# Patient Record
Sex: Female | Born: 1975
Health system: Southern US, Community
[De-identification: ages and names within clinical notes are randomized; demographics above are authoritative.]

## PROBLEM LIST (undated history)

## (undated) DIAGNOSIS — F329 Major depressive disorder, single episode, unspecified: Secondary | ICD-10-CM

## (undated) DIAGNOSIS — E785 Hyperlipidemia, unspecified: Secondary | ICD-10-CM

## (undated) DIAGNOSIS — F419 Anxiety disorder, unspecified: Secondary | ICD-10-CM

## (undated) DIAGNOSIS — I10 Essential (primary) hypertension: Secondary | ICD-10-CM

## (undated) DIAGNOSIS — E041 Nontoxic single thyroid nodule: Secondary | ICD-10-CM

## (undated) DIAGNOSIS — Z973 Presence of spectacles and contact lenses: Secondary | ICD-10-CM

## (undated) DIAGNOSIS — T7840XA Allergy, unspecified, initial encounter: Secondary | ICD-10-CM

## (undated) DIAGNOSIS — D649 Anemia, unspecified: Secondary | ICD-10-CM

## (undated) HISTORY — DX: Major depressive disorder, single episode, unspecified: F32.9

## (undated) HISTORY — DX: Essential (primary) hypertension: I10

## (undated) HISTORY — DX: Anxiety disorder, unspecified: F41.9

## (undated) HISTORY — DX: Hyperlipidemia, unspecified: E78.5

## (undated) HISTORY — DX: Presence of spectacles and contact lenses: Z97.3

## (undated) HISTORY — DX: Nontoxic single thyroid nodule: E04.1

## (undated) HISTORY — DX: Allergy, unspecified, initial encounter: T78.40XA

## (undated) HISTORY — PX: BUNIONECTOMY: SHX129

## (undated) HISTORY — DX: Anemia, unspecified: D64.9

---

## 2002-12-27 ENCOUNTER — Encounter: Payer: Self-pay | Admitting: Emergency Medicine

## 2002-12-27 ENCOUNTER — Emergency Department (HOSPITAL_COMMUNITY): Admission: EM | Admit: 2002-12-27 | Discharge: 2002-12-27 | Payer: Self-pay | Admitting: Emergency Medicine

## 2003-07-20 ENCOUNTER — Emergency Department (HOSPITAL_COMMUNITY): Admission: EM | Admit: 2003-07-20 | Discharge: 2003-07-20 | Payer: Self-pay | Admitting: Family Medicine

## 2003-09-30 ENCOUNTER — Emergency Department (HOSPITAL_COMMUNITY): Admission: EM | Admit: 2003-09-30 | Discharge: 2003-09-30 | Payer: Self-pay | Admitting: Family Medicine

## 2004-09-15 ENCOUNTER — Emergency Department (HOSPITAL_COMMUNITY): Admission: EM | Admit: 2004-09-15 | Discharge: 2004-09-15 | Payer: Self-pay | Admitting: Emergency Medicine

## 2005-02-08 ENCOUNTER — Emergency Department (HOSPITAL_COMMUNITY): Admission: EM | Admit: 2005-02-08 | Discharge: 2005-02-08 | Payer: Self-pay | Admitting: Family Medicine

## 2017-07-20 ENCOUNTER — Encounter: Payer: Self-pay | Admitting: Family

## 2017-08-03 ENCOUNTER — Encounter: Payer: Self-pay | Admitting: Family

## 2017-10-13 DIAGNOSIS — E785 Hyperlipidemia, unspecified: Secondary | ICD-10-CM

## 2017-10-13 DIAGNOSIS — F32A Depression, unspecified: Secondary | ICD-10-CM

## 2017-10-13 DIAGNOSIS — I10 Essential (primary) hypertension: Secondary | ICD-10-CM

## 2017-10-13 HISTORY — DX: Hyperlipidemia, unspecified: E78.5

## 2017-10-13 HISTORY — DX: Essential (primary) hypertension: I10

## 2017-10-13 HISTORY — DX: Depression, unspecified: F32.A

## 2017-10-25 ENCOUNTER — Ambulatory Visit (INDEPENDENT_AMBULATORY_CARE_PROVIDER_SITE_OTHER): Payer: Self-pay | Admitting: Physician Assistant

## 2017-10-29 ENCOUNTER — Encounter (INDEPENDENT_AMBULATORY_CARE_PROVIDER_SITE_OTHER): Payer: Self-pay | Admitting: Physician Assistant

## 2017-10-29 ENCOUNTER — Ambulatory Visit (INDEPENDENT_AMBULATORY_CARE_PROVIDER_SITE_OTHER): Payer: No Typology Code available for payment source | Admitting: Physician Assistant

## 2017-10-29 ENCOUNTER — Other Ambulatory Visit: Payer: Self-pay

## 2017-10-29 VITALS — BP 159/97 | HR 86 | Temp 98.0°F | Ht 64.5 in | Wt 157.8 lb

## 2017-10-29 DIAGNOSIS — F17200 Nicotine dependence, unspecified, uncomplicated: Secondary | ICD-10-CM | POA: Diagnosis not present

## 2017-10-29 DIAGNOSIS — N921 Excessive and frequent menstruation with irregular cycle: Secondary | ICD-10-CM

## 2017-10-29 DIAGNOSIS — F439 Reaction to severe stress, unspecified: Secondary | ICD-10-CM | POA: Diagnosis not present

## 2017-10-29 DIAGNOSIS — R030 Elevated blood-pressure reading, without diagnosis of hypertension: Secondary | ICD-10-CM

## 2017-10-29 DIAGNOSIS — F418 Other specified anxiety disorders: Secondary | ICD-10-CM | POA: Diagnosis not present

## 2017-10-29 DIAGNOSIS — R600 Localized edema: Secondary | ICD-10-CM

## 2017-10-29 MED ORDER — ESCITALOPRAM OXALATE 10 MG PO TABS: 10.0000 mg | ORAL_TABLET | Freq: Every day | ORAL | 2 refills | Status: DC

## 2017-10-29 MED ORDER — VARENICLINE TARTRATE 0.5 MG X 11 & 1 MG X 42 PO MISC: ORAL | 0 refills | Status: DC

## 2017-10-29 NOTE — Patient Instructions (Signed)
Living With Depression Everyone experiences occasional disappointment, sadness, and loss in their lives. When you are feeling down, blue, or sad for at least 2 weeks in a row, it may mean that you have depression. Depression can affect your thoughts and feelings, relationships, daily activities, and physical health. It is caused by changes in the way your brain functions. If you receive a diagnosis of depression, your health care provider will tell you which type of depression you have and what treatment options are available to you. If you are living with depression, there are ways to help you recover from it and also ways to prevent it from coming back. How to cope with lifestyle changes Coping with stress Stress is your body's reaction to life changes and events, both good and bad. Stressful situations may include:  Getting married.  The death of a spouse.  Losing a job.  Retiring.  Having a baby.  Stress can last just a few hours or it can be ongoing. Stress can play a major role in depression, so it is important to learn both how to cope with stress and how to think about it differently. Talk with your health care provider or a counselor if you would like to learn more about stress reduction. He or she may suggest some stress reduction techniques, such as:  Music therapy. This can include creating music or listening to music. Choose music that you enjoy and that inspires you.  Mindfulness-based meditation. This kind of meditation can be done while sitting or walking. It involves being aware of your normal breaths, rather than trying to control your breathing.  Centering prayer. This is a kind of meditation that involves focusing on a spiritual word or phrase. Choose a word, phrase, or sacred image that is meaningful to you and that brings you peace.  Deep breathing. To do this, expand your stomach and inhale slowly through your nose. Hold your breath for 3-5 seconds, then exhale  slowly, allowing your stomach muscles to relax.  Muscle relaxation. This involves intentionally tensing muscles then relaxing them.  Choose a stress reduction technique that fits your lifestyle and personality. Stress reduction techniques take time and practice to develop. Set aside 5-15 minutes a day to do them. Therapists can offer training in these techniques. The training may be covered by some insurance plans. Other things you can do to manage stress include:  Keeping a stress diary. This can help you learn what triggers your stress and ways to control your response.  Understanding what your limits are and saying no to requests or events that lead to a schedule that is too full.  Thinking about how you respond to certain situations. You may not be able to control everything, but you can control how you react.  Adding humor to your life by watching funny films or TV shows.  Making time for activities that help you relax and not feeling guilty about spending your time this way.  Medicines Your health care provider may suggest certain medicines if he or she feels that they will help improve your condition. Avoid using alcohol and other substances that may prevent your medicines from working properly (may interact). It is also important to:  Talk with your pharmacist or health care provider about all the medicines that you take, their possible side effects, and what medicines are safe to take together.  Make it your goal to take part in all treatment decisions (shared decision-making). This includes giving input on the side   effects of medicines. It is best if shared decision-making with your health care provider is part of your total treatment plan.  If your health care provider prescribes a medicine, you may not notice the full benefits of it for 4-8 weeks. Most people who are treated for depression need to be on medicine for at least 6-12 months after they feel better. If you are taking  medicines as part of your treatment, do not stop taking medicines without first talking to your health care provider. You may need to have the medicine slowly decreased (tapered) over time to decrease the risk of harmful side effects. Relationships Your health care provider may suggest family therapy along with individual therapy and drug therapy. While there may not be family problems that are causing you to feel depressed, it is still important to make sure your family learns as much as they can about your mental health. Having your family's support can help make your treatment successful. How to recognize changes in your condition Everyone has a different response to treatment for depression. Recovery from major depression happens when you have not had signs of major depression for two months. This may mean that you will start to:  Have more interest in doing activities.  Feel less hopeless than you did 2 months ago.  Have more energy.  Overeat less often, or have better or improving appetite.  Have better concentration.  Your health care provider will work with you to decide the next steps in your recovery. It is also important to recognize when your condition is getting worse. Watch for these signs:  Having fatigue or low energy.  Eating too much or too little.  Sleeping too much or too little.  Feeling restless, agitated, or hopeless.  Having trouble concentrating or making decisions.  Having unexplained physical complaints.  Feeling irritable, angry, or aggressive.  Get help as soon as you or your family members notice these symptoms coming back. How to get support and help from others How to talk with friends and family members about your condition Talking to friends and family members about your condition can provide you with one way to get support and guidance. Reach out to trusted friends or family members, explain your symptoms to them, and let them know that you are  working with a health care provider to treat your depression. Financial resources Not all insurance plans cover mental health care, so it is important to check with your insurance carrier. If paying for co-pays or counseling services is a problem, search for a local or county mental health care center. They may be able to offer public mental health care services at low or no cost when you are not able to see a private health care provider. If you are taking medicine for depression, you may be able to get the generic form, which may be less expensive. Some makers of prescription medicines also offer help to patients who cannot afford the medicines they need. Follow these instructions at home:  Get the right amount and quality of sleep.  Cut down on using caffeine, tobacco, alcohol, and other potentially harmful substances.  Try to exercise, such as walking or lifting small weights.  Take over-the-counter and prescription medicines only as told by your health care provider.  Eat a healthy diet that includes plenty of vegetables, fruits, whole grains, low-fat dairy products, and lean protein. Do not eat a lot of foods that are high in solid fats, added sugars, or salt.    Keep all follow-up visits as told by your health care provider. This is important. Contact a health care provider if:  You stop taking your antidepressant medicines, and you have any of these symptoms: ? Nausea. ? Headache. ? Feeling lightheaded. ? Chills and body aches. ? Not being able to sleep (insomnia).  You or your friends and family think your depression is getting worse. Get help right away if:  You have thoughts of hurting yourself or others. If you ever feel like you may hurt yourself or others, or have thoughts about taking your own life, get help right away. You can go to your nearest emergency department or call:  Your local emergency services (911 in the [place]  A suicide crisis helpline, such as the  National Suicide Prevention Lifeline at 1-800-273-8255. This is open 24-hours a day.  Summary  If you are living with depression, there are ways to help you recover from it and also ways to prevent it from coming back.  Work with your health care team to create a management plan that includes counseling, stress management techniques, and healthy lifestyle habits. This information is not intended to replace advice given to you by your health care provider. Make sure you discuss any questions you have with your health care provider. Document Released: 04/03/2016 Document Revised: 04/03/2016 Document Reviewed: 04/03/2016 Elsevier Interactive Patient Education  2018 [employer]  

## 2017-10-29 NOTE — Progress Notes (Signed)
Subjective:  Patient ID: Taylor Booth, female    DOB: 1976/03/12  Age: 42 y.o. MRN: [id number]  CC: edema in feet  HPI Taylor Booth is a 42 y.o. female with a medical history of s/p BTL, bunion removal, depression and tobacco use disorder presents as a new patient with complaint of bilateral lower extremity edema and irregular menstruation. BP elevated today. Says she is highly stressed about her 10322 yo son that is suicidal. She has involuntarily committed her son to inpatient psychiatry. 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 Feels much stress at work, insomnia, irritability. Took paxil for approximately two months before quitting on her own because she felt better and interfered with sex drive. Pt smokes 3-4 cigarettes a day for approximately 10 years. Would like to quit smoking. PHQ9 15 and GAD7 15 in clinic today.     Menstrual irregularity onset approximately six months ago. Denies history fibroids or oophorectomy. No current birth control use.      ROS Review of Systems  Constitutional: Negative for chills, fever and malaise/fatigue.  Eyes: Negative for blurred vision.  Respiratory: Negative for shortness of breath.   Cardiovascular: Positive for leg swelling. Negative for chest pain and palpitations.  Gastrointestinal: Negative for abdominal pain and nausea.  Genitourinary: Negative for dysuria and hematuria.  Musculoskeletal: Negative for joint pain and myalgias.  Skin: Negative for rash.  Neurological: Negative for tingling and headaches.  Psychiatric/Behavioral: Positive for depression. The patient is nervous/anxious and has insomnia.     Objective:  BP (!) 159/97 (BP Location: Left Arm, Patient Position: Sitting, Cuff Size: Normal)   Pulse 86   Temp 98 F (36.7 C) (Oral)   Ht 5' 4.5" (1.638 m)   Wt 157 lb 12.8 oz (71.6 kg)   LMP 10/07/2017 (Exact Date) Comment: patient had a light menstrual again on june 3   SpO2 98%   BMI 26.67 kg/m   BP/Weight  10/29/2017  Systolic BP 159  Diastolic BP 97  Wt. (Lbs) 157.8  BMI 26.67      Physical Exam  Constitutional: She is oriented to person, place, and time.  Well developed, well nourished, NAD, polite  HENT:  Head: Normocephalic and atraumatic.  Eyes: No scleral icterus.  Neck: Normal range of motion. Neck supple. No thyromegaly present.  Cardiovascular: Normal rate, regular rhythm, normal heart sounds and intact distal pulses. Exam reveals no gallop and no friction rub.  No murmur heard. 1+ pitting edema of the lower extremity bilaterally.   Pulmonary/Chest: Effort normal. She has no wheezes. She has no rales.  Musculoskeletal: She exhibits no edema.  Neurological: She is alert and oriented to person, place, and time.  Skin: Skin is warm and dry. No rash noted. No erythema. No pallor.  Psychiatric: She has a normal mood and affect. Her behavior is normal. Thought content normal.  Vitals reviewed.    Assessment & Plan:   1. Elevated blood-pressure reading without diagnosis of hypertension - Lipid panel - Comprehensive metabolic panel - CBC with Differential - TSH - Return Friday for recheck and possible tx for HTN.   2. Lower extremity edema - Lipid panel - Comprehensive metabolic panel - CBC with Differential - TSH  3. Stress at home - Lipid panel - Comprehensive metabolic panel - CBC with Differential - TSH  4. Depression with anxiety - Begin Lexapro - Messaged Ms. Jenel LucksJasmine Lewis LCSW to reach out to pt for counseling.   5. Tobacco use disorder - Lipid panel - Comprehensive  metabolic panel - CBC with Differential - TSH - Begin Chantix  6. Menometrorrhagia - Advised patient to take OTC Aleve. Did not have time to address all her complaints. I asked she return to discuss further with possible orders for imaging. Will assess her TSH today.    Meds ordered this encounter  Medications  . varenicline (CHANTIX STARTING MONTH PAK) 0.5 MG X 11 & 1 MG X 42 tablet     Sig: Take one 0.5 mg tablet by mouth once daily for 3 days, then increase to one 0.5 mg tablet twice daily for 4 days, then increase to one 1 mg tablet twice daily.    Dispense:  53 tablet    Refill:  0    Order Specific Question:   Supervising Provider    Answer:   Hoy Register [[id number]]  . escitalopram (LEXAPRO) 10 MG tablet    Sig: Take 1 tablet (10 mg total) by mouth daily.    Dispense:  30 tablet    Refill:  2    Order Specific Question:   Supervising Provider    Answer:   Hoy Register [[id number]]    Follow-up: Return in about 4 days (around 11/02/2017) for elevated blood pressure. Loletta Specter PA

## 2017-10-30 ENCOUNTER — Telehealth (INDEPENDENT_AMBULATORY_CARE_PROVIDER_SITE_OTHER): Payer: Self-pay

## 2017-10-30 ENCOUNTER — Other Ambulatory Visit (INDEPENDENT_AMBULATORY_CARE_PROVIDER_SITE_OTHER): Payer: Self-pay | Admitting: Physician Assistant

## 2017-10-30 DIAGNOSIS — E7841 Elevated Lipoprotein(a): Secondary | ICD-10-CM

## 2017-10-30 DIAGNOSIS — D649 Anemia, unspecified: Secondary | ICD-10-CM

## 2017-10-30 DIAGNOSIS — R7989 Other specified abnormal findings of blood chemistry: Secondary | ICD-10-CM

## 2017-10-30 LAB — COMPREHENSIVE METABOLIC PANEL
ALK PHOS: 95 IU/L (ref 39–117)
ALT: 15 IU/L (ref 0–32)
AST: 18 IU/L (ref 0–40)
Albumin/Globulin Ratio: 1.2 (ref 1.2–2.2)
Albumin: 3 g/dL — ABNORMAL LOW (ref 3.5–5.5)
BUN/Creatinine Ratio: 21 (ref 9–23)
BUN: 12 mg/dL (ref 6–24)
Bilirubin Total: <0.2 mg/dL (ref 0.0–1.2)
CO2: 25 mmol/L (ref 20–29)
CREATININE: 0.57 mg/dL (ref 0.57–1.00)
Calcium: 8.6 mg/dL — ABNORMAL LOW (ref 8.7–10.2)
Chloride: 102 mmol/L (ref 96–106)
GFR calc Af Amer: 130 mL/min/{1.73_m2} (ref >59)
GFR calc non Af Amer: 113 mL/min/{1.73_m2} (ref >59)
GLUCOSE: 90 mg/dL (ref 65–99)
Globulin, Total: 2.5 g/dL (ref 1.5–4.5)
POTASSIUM: 3.5 mmol/L (ref 3.5–5.2)
SODIUM: 140 mmol/L (ref 134–144)
Total Protein: 5.5 g/dL — ABNORMAL LOW (ref 6.0–8.5)

## 2017-10-30 LAB — CBC WITH DIFFERENTIAL/PLATELET
Basophils Absolute: 0 10*3/uL (ref 0.0–0.2)
Basos: 0 %
EOS (ABSOLUTE): 0.1 10*3/uL (ref 0.0–0.4)
Eos: 2 %
Hematocrit: 29.6 % — ABNORMAL LOW (ref 34.0–46.6)
Hemoglobin: 9.1 g/dL — ABNORMAL LOW (ref 11.1–15.9)
IMMATURE GRANULOCYTES: 0 %
Immature Grans (Abs): 0 10*3/uL (ref 0.0–0.1)
Lymphocytes Absolute: 1.3 10*3/uL (ref 0.7–3.1)
Lymphs: 23 %
MCH: 24 pg — ABNORMAL LOW (ref 26.6–33.0)
MCHC: 30.7 g/dL — ABNORMAL LOW (ref 31.5–35.7)
MCV: 78 fL — ABNORMAL LOW (ref 79–97)
MONOS ABS: 0.8 10*3/uL (ref 0.1–0.9)
Monocytes: 14 %
NEUTROS PCT: 61 %
Neutrophils Absolute: 3.6 10*3/uL (ref 1.4–7.0)
PLATELETS: 550 10*3/uL — AB (ref 150–450)
RBC: 3.79 x10E6/uL (ref 3.77–5.28)
RDW: 17.8 % — AB (ref 12.3–15.4)
WBC: 5.8 10*3/uL (ref 3.4–10.8)

## 2017-10-30 LAB — LIPID PANEL
CHOL/HDL RATIO: 3.7 ratio (ref 0.0–4.4)
CHOLESTEROL TOTAL: 264 mg/dL — AB (ref 100–199)
HDL: 72 mg/dL (ref >39)
LDL CALC: 167 mg/dL — AB (ref 0–99)
Triglycerides: 125 mg/dL (ref 0–149)
VLDL CHOLESTEROL CAL: 25 mg/dL (ref 5–40)

## 2017-10-30 LAB — TSH: TSH: 0.262 u[IU]/mL — ABNORMAL LOW (ref 0.450–4.500)

## 2017-10-30 MED ORDER — FERROUS SULFATE 325 (65 FE) MG PO TBEC: 325.0000 mg | DELAYED_RELEASE_TABLET | Freq: Three times a day (TID) | ORAL | 1 refills | Status: DC

## 2017-10-30 MED ORDER — ATORVASTATIN CALCIUM 40 MG PO TABS: 40.0000 mg | ORAL_TABLET | Freq: Every day | ORAL | 1 refills | Status: DC

## 2017-10-30 NOTE — Telephone Encounter (Signed)
Patients insurance will not cover Chantix. They will cover Topamax, please send to [facility] on [address]. Maryjean Mornempestt S Danarius Mcconathy, CMA

## 2017-10-30 NOTE — Telephone Encounter (Signed)
I have not heard of Topamax being used for smoking cessation. I checked uptodate and do not see tobacco use as an indication for use. She will need to call again and find out what else her insurance will cover.

## 2017-10-30 NOTE — Telephone Encounter (Signed)
Patient will call insurance company again to inquire on what they will cover for smoking cessation. If nothing else patient is aware that the only other option is to apply for the PASS program through [facility] to have the chantix covered at possibly no cost. Maryjean Mornempestt S Emir Nack, CMA

## 2017-10-31 ENCOUNTER — Telehealth (INDEPENDENT_AMBULATORY_CARE_PROVIDER_SITE_OTHER): Payer: Self-pay

## 2017-10-31 NOTE — Telephone Encounter (Signed)
Patient called regarding another issue. While on the call patient was informed of results. Patient is aware of elevated LDL and atorvastatin being sent to pharmacy. Advised to refrain from getting pregnant while taking atorvastatin. Patient is aware that she is anemic and iron pills have been sent to her pharmacy. Patient aware of abnormal TSH, need for full thyroid panel. Patient will come in on 6/21 for thyroid panel and blood pressure check. Maryjean Mornempestt S Hind Chesler, CMA

## 2017-10-31 NOTE — Telephone Encounter (Signed)
-----   Message from Loletta Specteroger David Gomez, PA-C sent at 10/30/2017  5:22 PM EDT ----- LDL elevated. Pt anemic. TSH abnormal. I will send atorvastatin and iron pills to [facility] at [address]. Remind to take care against pregnancy with atorvastatin use. Also, I have ordered thyroid panel for her to come in and get done, no consult necessary.

## 2017-11-01 ENCOUNTER — Other Ambulatory Visit (INDEPENDENT_AMBULATORY_CARE_PROVIDER_SITE_OTHER): Payer: Self-pay | Admitting: Physician Assistant

## 2017-11-01 MED ORDER — VARENICLINE TARTRATE 0.5 MG X 11 & 1 MG X 42 PO MISC: ORAL | 0 refills | Status: DC

## 2017-11-01 NOTE — Telephone Encounter (Signed)
Pt will have to pick up rx because instructions are too long to be transmitted through Epic.

## 2017-11-01 NOTE — Telephone Encounter (Signed)
Patients insurance will only pay for nicotine patches and gum. Patient would like to have Rx for chantix sent to [facility], and apply for PASS to cover the cost.

## 2017-11-01 NOTE — Telephone Encounter (Signed)
Patient aware and will pick up in the morning during lab visit. Maryjean Mornempestt S Roberts, CMA

## 2017-11-02 ENCOUNTER — Encounter (INDEPENDENT_AMBULATORY_CARE_PROVIDER_SITE_OTHER): Payer: Self-pay

## 2017-11-02 ENCOUNTER — Other Ambulatory Visit (INDEPENDENT_AMBULATORY_CARE_PROVIDER_SITE_OTHER): Payer: Self-pay | Admitting: Physician Assistant

## 2017-11-02 ENCOUNTER — Telehealth: Payer: Self-pay | Admitting: Licensed Clinical Social Worker

## 2017-11-02 ENCOUNTER — Ambulatory Visit (INDEPENDENT_AMBULATORY_CARE_PROVIDER_SITE_OTHER): Payer: No Typology Code available for payment source | Admitting: Physician Assistant

## 2017-11-02 DIAGNOSIS — R7989 Other specified abnormal findings of blood chemistry: Secondary | ICD-10-CM | POA: Diagnosis not present

## 2017-11-02 DIAGNOSIS — I10 Essential (primary) hypertension: Secondary | ICD-10-CM

## 2017-11-02 MED ORDER — HYDROCHLOROTHIAZIDE 25 MG PO TABS: 25.0000 mg | ORAL_TABLET | Freq: Every day | ORAL | 11 refills | Status: DC

## 2017-11-02 MED FILL — HYDROCHLOROTHIAZIDE 25 MG T: 25 | 30 days supply | Qty: 30 | Fill #0

## 2017-11-02 NOTE — Patient Instructions (Signed)
Managing Your Hypertension Hypertension is commonly called high blood pressure. This is when the force of your blood pressing against the walls of your arteries is too strong. Arteries are blood vessels that carry blood from your heart throughout your body. Hypertension forces the heart to work harder to pump blood, and may cause the arteries to become narrow or stiff. Having untreated or uncontrolled hypertension can cause heart attack, stroke, kidney disease, and other problems. What are blood pressure readings? A blood pressure reading consists of a higher number over a lower number. Ideally, your blood pressure should be below 120/80. The first ("top") number is called the systolic pressure. It is a measure of the pressure in your arteries as your heart beats. The second ("bottom") number is called the diastolic pressure. It is a measure of the pressure in your arteries as the heart relaxes. What does my blood pressure reading mean? Blood pressure is classified into four stages. Based on your blood pressure reading, your health care provider may use the following stages to determine what type of treatment you need, if any. Systolic pressure and diastolic pressure are measured in a unit called mm Hg. Normal  Systolic pressure: below 120.  Diastolic pressure: below 80. Elevated  Systolic pressure: 120-129.  Diastolic pressure: below 80. Hypertension stage 1  Systolic pressure: 130-139.  Diastolic pressure: 80-89. Hypertension stage 2  Systolic pressure: 140 or above.  Diastolic pressure: 90 or above. What health risks are associated with hypertension? Managing your hypertension is an important responsibility. Uncontrolled hypertension can lead to:  A heart attack.  A stroke.  A weakened blood vessel (aneurysm).  Heart failure.  Kidney damage.  Eye damage.  Metabolic syndrome.  Memory and concentration problems.  What changes can I make to manage my  hypertension? Hypertension can be managed by making lifestyle changes and possibly by taking medicines. Your health care provider will help you make a plan to bring your blood pressure within a normal range. Eating and drinking  Eat a diet that is high in fiber and potassium, and low in salt (sodium), added sugar, and fat. An example eating plan is called the DASH (Dietary Approaches to Stop Hypertension) diet. To eat this way: ? Eat plenty of fresh fruits and vegetables. Try to fill half of your plate at each meal with fruits and vegetables. ? Eat whole grains, such as whole wheat pasta, brown rice, or whole grain bread. Fill about one quarter of your plate with whole grains. ? Eat low-fat diary products. ? Avoid fatty cuts of meat, processed or cured meats, and poultry with skin. Fill about one quarter of your plate with lean proteins such as fish, chicken without skin, beans, eggs, and tofu. ? Avoid premade and processed foods. These tend to be higher in sodium, added sugar, and fat.  Reduce your daily sodium intake. Most people with hypertension should eat less than 1,500 mg of sodium a day.  Limit alcohol intake to no more than 1 drink a day for nonpregnant women and 2 drinks a day for men. One drink equals 12 oz of beer, 5 oz of wine, or 1 oz of hard liquor. Lifestyle  Work with your health care provider to maintain a healthy body weight, or to lose weight. Ask what an ideal weight is for you.  Get at least 30 minutes of exercise that causes your heart to beat faster (aerobic exercise) most days of the week. Activities may include walking, swimming, or biking.  Include exercise   to strengthen your muscles (resistance exercise), such as weight lifting, as part of your weekly exercise routine. Try to do these types of exercises for 30 minutes at least 3 days a week.  Do not use any products that contain nicotine or tobacco, such as cigarettes and e-cigarettes. If you need help quitting, ask  your health care provider.  Control any long-term (chronic) conditions you have, such as high cholesterol or diabetes. Monitoring  Monitor your blood pressure at home as told by your health care provider. Your personal target blood pressure may vary depending on your medical conditions, your age, and other factors.  Have your blood pressure checked regularly, as often as told by your health care provider. Working with your health care provider  Review all the medicines you take with your health care provider because there may be side effects or interactions.  Talk with your health care provider about your diet, exercise habits, and other lifestyle factors that may be contributing to hypertension.  Visit your health care provider regularly. Your health care provider can help you create and adjust your plan for managing hypertension. Will I need medicine to control my blood pressure? Your health care provider may prescribe medicine if lifestyle changes are not enough to get your blood pressure under control, and if:  Your systolic blood pressure is 130 or higher.  Your diastolic blood pressure is 80 or higher.  Take medicines only as told by your health care provider. Follow the directions carefully. Blood pressure medicines must be taken as prescribed. The medicine does not work as well when you skip doses. Skipping doses also puts you at risk for problems. Contact a health care provider if:  You think you are having a reaction to medicines you have taken.  You have repeated (recurrent) headaches.  You feel dizzy.  You have swelling in your ankles.  You have trouble with your vision. Get help right away if:  You develop a severe headache or confusion.  You have unusual weakness or numbness, or you feel faint.  You have severe pain in your chest or abdomen.  You vomit repeatedly.  You have trouble breathing. Summary  Hypertension is when the force of blood pumping through  your arteries is too strong. If this condition is not controlled, it may put you at risk for serious complications.  Your personal target blood pressure may vary depending on your medical conditions, your age, and other factors. For most people, a normal blood pressure is less than 120/80.  Hypertension is managed by lifestyle changes, medicines, or both. Lifestyle changes include weight loss, eating a healthy, low-sodium diet, exercising more, and limiting alcohol. This information is not intended to replace advice given to you by your health care provider. Make sure you discuss any questions you have with your health care provider. Document Released: 01/24/2012 Document Revised: 03/29/2016 Document Reviewed: 03/29/2016 Elsevier Interactive Patient Education  2018 [employer]  DASH Eating Plan DASH stands for "Dietary Approaches to Stop Hypertension." The DASH eating plan is a healthy eating plan that has been shown to reduce high blood pressure (hypertension). It may also reduce your risk for type 2 diabetes, heart disease, and stroke. The DASH eating plan may also help with weight loss. What are tips for following this plan? General guidelines  Avoid eating more than 2,300 mg (milligrams) of salt (sodium) a day. If you have hypertension, you may need to reduce your sodium intake to 1,500 mg a day.  Limit alcohol intake   to no more than 1 drink a day for nonpregnant women and 2 drinks a day for men. One drink equals 12 oz of beer, 5 oz of wine, or 1 oz of hard liquor.  Work with your health care provider to maintain a healthy body weight or to lose weight. Ask what an ideal weight is for you.  Get at least 30 minutes of exercise that causes your heart to beat faster (aerobic exercise) most days of the week. Activities may include walking, swimming, or biking.  Work with your health care provider or diet and nutrition specialist (dietitian) to adjust your eating plan to your individual  calorie needs. Reading food labels  Check food labels for the amount of sodium per serving. Choose foods with less than 5 percent of the Daily Value of sodium. Generally, foods with less than 300 mg of sodium per serving fit into this eating plan.  To find whole grains, look for the word "whole" as the first word in the ingredient list. Shopping  Buy products labeled as "low-sodium" or "no salt added."  Buy fresh foods. Avoid canned foods and premade or frozen meals. Cooking  Avoid adding salt when cooking. Use salt-free seasonings or herbs instead of table salt or sea salt. Check with your health care provider or pharmacist before using salt substitutes.  Do not fry foods. Cook foods using healthy methods such as baking, boiling, grilling, and broiling instead.  Cook with heart-healthy oils, such as olive, canola, soybean, or sunflower oil. Meal planning   Eat a balanced diet that includes: ? 5 or more servings of fruits and vegetables each day. At each meal, try to fill half of your plate with fruits and vegetables. ? Up to 6-8 servings of whole grains each day. ? Less than 6 oz of lean meat, poultry, or fish each day. A 3-oz serving of meat is about the same size as a deck of cards. One egg equals 1 oz. ? 2 servings of low-fat dairy each day. ? A serving of nuts, seeds, or beans 5 times each week. ? Heart-healthy fats. Healthy fats called Omega-3 fatty acids are found in foods such as flaxseeds and coldwater fish, like sardines, salmon, and mackerel.  Limit how much you eat of the following: ? Canned or prepackaged foods. ? Food that is high in trans fat, such as fried foods. ? Food that is high in saturated fat, such as fatty meat. ? Sweets, desserts, sugary drinks, and other foods with added sugar. ? Full-fat dairy products.  Do not salt foods before eating.  Try to eat at least 2 vegetarian meals each week.  Eat more home-cooked food and less restaurant, buffet, and fast  food.  When eating at a restaurant, ask that your food be prepared with less salt or no salt, if possible. What foods are recommended? The items listed may not be a complete list. Talk with your dietitian about what dietary choices are best for you. Grains Whole-grain or whole-wheat bread. Whole-grain or whole-wheat pasta. Brown rice. Oatmeal. Quinoa. Bulgur. Whole-grain and low-sodium cereals. Pita bread. Low-fat, low-sodium crackers. Whole-wheat flour tortillas. Vegetables Fresh or frozen vegetables (raw, steamed, roasted, or grilled). Low-sodium or reduced-sodium tomato and vegetable juice. Low-sodium or reduced-sodium tomato sauce and tomato paste. Low-sodium or reduced-sodium canned vegetables. Fruits All fresh, dried, or frozen fruit. Canned fruit in natural juice (without added sugar). Meat and other protein foods Skinless chicken or turkey. Ground chicken or turkey. Pork with fat trimmed off. Fish   and seafood. Egg whites. Dried beans, peas, or lentils. Unsalted nuts, nut butters, and seeds. Unsalted canned beans. Lean cuts of beef with fat trimmed off. Low-sodium, lean deli meat. Dairy Low-fat (1%) or fat-free (skim) milk. Fat-free, low-fat, or reduced-fat cheeses. Nonfat, low-sodium ricotta or cottage cheese. Low-fat or nonfat yogurt. Low-fat, low-sodium cheese. Fats and oils Soft margarine without trans fats. Vegetable oil. Low-fat, reduced-fat, or light mayonnaise and salad dressings (reduced-sodium). Canola, safflower, olive, soybean, and sunflower oils. Avocado. Seasoning and other foods Herbs. Spices. Seasoning mixes without salt. Unsalted popcorn and pretzels. Fat-free sweets. What foods are not recommended? The items listed may not be a complete list. Talk with your dietitian about what dietary choices are best for you. Grains Baked goods made with fat, such as croissants, muffins, or some breads. Dry pasta or rice meal packs. Vegetables Creamed or fried vegetables. Vegetables  in a cheese sauce. Regular canned vegetables (not low-sodium or reduced-sodium). Regular canned tomato sauce and paste (not low-sodium or reduced-sodium). Regular tomato and vegetable juice (not low-sodium or reduced-sodium). Pickles. Olives. Fruits Canned fruit in a light or heavy syrup. Fried fruit. Fruit in cream or butter sauce. Meat and other protein foods Fatty cuts of meat. Ribs. Fried meat. Bacon. Sausage. Bologna and other processed lunch meats. Salami. Fatback. Hotdogs. Bratwurst. Salted nuts and seeds. Canned beans with added salt. Canned or smoked fish. Whole eggs or egg yolks. Chicken or turkey with skin. Dairy Whole or 2% milk, cream, and half-and-half. Whole or full-fat cream cheese. Whole-fat or sweetened yogurt. Full-fat cheese. Nondairy creamers. Whipped toppings. Processed cheese and cheese spreads. Fats and oils Butter. Stick margarine. Lard. Shortening. Ghee. Bacon fat. Tropical oils, such as coconut, palm kernel, or palm oil. Seasoning and other foods Salted popcorn and pretzels. Onion salt, garlic salt, seasoned salt, table salt, and sea salt. Worcestershire sauce. Tartar sauce. Barbecue sauce. Teriyaki sauce. Soy sauce, including reduced-sodium. Steak sauce. Canned and packaged gravies. Fish sauce. Oyster sauce. Cocktail sauce. Horseradish that you find on the shelf. Ketchup. Mustard. Meat flavorings and tenderizers. Bouillon cubes. Hot sauce and Tabasco sauce. Premade or packaged marinades. Premade or packaged taco seasonings. Relishes. Regular salad dressings. Where to find more information:  National Heart, Lung, and Blood Institute: www.nhlbi.nih.gov  American Heart Association: www.heart.org Summary  The DASH eating plan is a healthy eating plan that has been shown to reduce high blood pressure (hypertension). It may also reduce your risk for type 2 diabetes, heart disease, and stroke.  With the DASH eating plan, you should limit salt (sodium) intake to 2,300 mg a  day. If you have hypertension, you may need to reduce your sodium intake to 1,500 mg a day.  When on the DASH eating plan, aim to eat more fresh fruits and vegetables, whole grains, lean proteins, low-fat dairy, and heart-healthy fats.  Work with your health care provider or diet and nutrition specialist (dietitian) to adjust your eating plan to your individual calorie needs. This information is not intended to replace advice given to you by your health care provider. Make sure you discuss any questions you have with your health care provider. Document Released: 04/20/2011 Document Revised: 04/24/2016 Document Reviewed: 04/24/2016 Elsevier Interactive Patient Education  2018 Elsevier Inc.  

## 2017-11-02 NOTE — Telephone Encounter (Signed)
LCSWA attempted to contact pt to follow up on behavioral health screens from prior appointment. LCSWA left message for a return call.  

## 2017-11-02 NOTE — Progress Notes (Signed)
Nurse visit: BP still elevated. HTN confirmed.   Pt will likely need two anti-hypertensive medications to control BP. Patient prefers to take one anti-hypertensive and diet/exercise/low sodium restrictions. Advised not to consume more than 2000 mg of sodium per day.

## 2017-11-02 NOTE — Progress Notes (Signed)
Nurse visit for BP check. HTN confirmed today.   Pt would likely need two antihypertensives for control of BP. Pt prefers to try one anti-hypertensive and diet/exercise. She was advised on consuming less than 2000 mg of sodium per day.

## 2017-11-03 LAB — THYROID PANEL WITH TSH
Free Thyroxine Index: 1.6 (ref 1.2–4.9)
T3 UPTAKE RATIO: 25 % (ref 24–39)
T4, Total: 6.3 ug/dL (ref 4.5–12.0)
TSH: 0.272 u[IU]/mL — ABNORMAL LOW (ref 0.450–4.500)

## 2017-11-05 ENCOUNTER — Telehealth (INDEPENDENT_AMBULATORY_CARE_PROVIDER_SITE_OTHER): Payer: Self-pay | Admitting: Physician Assistant

## 2017-11-05 ENCOUNTER — Other Ambulatory Visit (INDEPENDENT_AMBULATORY_CARE_PROVIDER_SITE_OTHER): Payer: Self-pay | Admitting: Physician Assistant

## 2017-11-05 MED ORDER — AMLODIPINE BESYLATE 10 MG PO TABS: 10.0000 mg | ORAL_TABLET | Freq: Every day | ORAL | 5 refills | Status: DC

## 2017-11-05 NOTE — Telephone Encounter (Signed)
Patient states all these symptoms began after she took hydrochlorothiazide. Please advise. Taylor Booth S Taylor Booth, CMA

## 2017-11-05 NOTE — Telephone Encounter (Signed)
Patient called stating that on Saturday 11-03-2017 she was feeling nervous, overwhelmed, over heating and had shortens of breath. Patient states that over the weekend she had muscle spasms and on Saturday it started off with two and progressed to have a lot more over Sunday all day and was waken up every hour at night to have to walk it out.   Please Follow Up [phone]  Thank you

## 2017-11-05 NOTE — Telephone Encounter (Signed)
She may stop HCTZ. I suspect she was having symptoms of dehydration. I will switch to amlodipine.

## 2017-11-05 NOTE — Telephone Encounter (Signed)
Left patient a message to stop taking HCTZ. Will be switched to amlodipine. Call [facility] with any questions. Maryjean Mornempestt S Hartlyn Reigel, CMA

## 2017-11-06 MED FILL — AMLODIPINE BESYLATE 10 MG T: 10 | 30 days supply | Qty: 30 | Fill #0

## 2017-11-09 ENCOUNTER — Telehealth: Payer: Self-pay

## 2017-11-09 NOTE — Telephone Encounter (Signed)
-----   Message from Loletta Specteroger David Gomez, PA-C sent at 11/06/2017  6:00 PM EDT ----- Subclinical hyperthyroidism. She will need to repeat thyroid panel in three months. If persistent, she will need further testing.

## 2017-11-09 NOTE — Telephone Encounter (Signed)
Patient aware of subclinical hyperthyroidism. Repeat thyroid panel in three months, if persistent will need further testing. Maryjean Mornempestt S Roberts, CMA

## 2017-11-16 ENCOUNTER — Ambulatory Visit
Payer: No Typology Code available for payment source | Attending: Physician Assistant | Admitting: Licensed Clinical Social Worker

## 2017-11-16 DIAGNOSIS — F32A Depression, unspecified: Secondary | ICD-10-CM

## 2017-11-16 DIAGNOSIS — F329 Major depressive disorder, single episode, unspecified: Secondary | ICD-10-CM

## 2017-11-16 NOTE — BH Specialist Note (Signed)
Integrated Behavioral Health Initial Visit  MRN: [id number] Name: Taylor Booth  Number of Integrated Behavioral Health Clinician visits:: 1/6 Session Start time: 4:10 PM  Session End time: 4:40 PM  Total time: 30 minutes  Type of Service: Integrated Behavioral Health- Individual/Family Interpretor:No. Interpretor Name and Language: N/A   Warm Hand Off Completed.       SUBJECTIVE: Taylor Regalandra Aerts is a 42 y.o. female accompanied by self Patient was referred by Conley SimmondsPA-C Gomez for depression and anxiety. Patient reports the following symptoms/concerns: decreased pleasure in doing things, feelings of sadness and worry, difficulty sleeping, low energy, decreased concentration, restlessness, and irritability Duration of problem: "for a while"; Severity of problem: moderate  OBJECTIVE: Mood: Appropriate and Affect: Appropriate Risk of harm to self or others: No plan to harm self or others  LIFE CONTEXT: Family and Social: Pt has two adult children, [place], youngest [place] in [place]. Pt reports receiving support from family and friends School/Work: Pt is employed full time.  Self-Care: Pt enjoys reading, cooking, listening to music, and meditating. She participates in medication management through PCP Life Changes: Pt reports recently having to IVC adult child due to his MH conditions. She receives limited support, has a very "demanding" job, and has difficulty managing medical condition   GOALS ADDRESSED: Patient will: 1. Reduce symptoms of: anxiety and depression 2. Increase knowledge and/or ability of: coping skills and healthy habits  3. Demonstrate ability to: Increase healthy adjustment to current life circumstances and Increase adequate support systems for patient/family  INTERVENTIONS: Interventions utilized: Mindfulness or Management consultantelaxation Training, Supportive Counseling and Psychoeducation and/or Health Education  Standardized Assessments completed: Not  Needed  ASSESSMENT: Patient currently experiencing depression and anxiety triggered by stress from work, difficulty managing medical condition, and ongoing worry about the mental health of a child who resides out of state. She reports decreased pleasure in doing things, feelings of sadness and worry, difficulty sleeping, low energy, decreased concentration, restlessness, and irritability. Denies SI/HI/AVH.    Patient may benefit from psychoeducation and psychotherapy. LCSWA educated pt on the correlation between one's physical and mental health. Pt had good insight on triggers and was successful in identifying healthy coping skills. LCSWA discussed healthy strategies to assist in stress management. Pt is participating in medication management through PCP and reports an increase in positive mood and motivation, since taking Lexapro.  Pt states that she is taking her BP medication as prescribed; however, has some concerns regarding side effects. LCSWA will message CPP, Georgiana ShoreLuke Van Ausdall to reach out for medication management.   PLAN: 1. Follow up with behavioral health clinician on : Pt was encouraged to contact LCSWA if symptoms worsen or fail to improve [facility] 2. Behavioral recommendations: LCSWA recommends that pt apply healthy coping skills discussed. Pt is encouraged to schedule follow up appointment with LCSWA 3. Referral(s): Integrated Behavioral Health Services (In Clinic) 4. "From scale of 1-10, how likely are you to follow plan?":   Bridgett LarssonJasmine D Missey Hasley, LCSW 11/19/17 9:19 AM

## 2017-11-19 ENCOUNTER — Telehealth: Payer: Self-pay | Admitting: Pharmacist

## 2017-11-19 NOTE — Telephone Encounter (Signed)
-----   Message from Bridgett LarssonJasmine D Lewis, LCSW sent at 11/19/2017  9:20 AM EDT ----- Regarding: Appointment needed Pt states that she is taking her BP medication as prescribed; however, has some concerns regarding side effects. Could you reach out for medication management.

## 2017-11-19 NOTE — Telephone Encounter (Signed)
PC placed @ 12:10 PM. [facility] that she is having a suspected side effect to her blood pressure medication. Left VM instructing patient to call me back to determine what is going on. Will follow-up with her as she able to get in-touch with me.

## 2017-11-19 NOTE — Telephone Encounter (Signed)
Left message asking patient to call the office. Tempestt S Roberts, CMA  

## 2017-11-19 NOTE — Telephone Encounter (Signed)
Taylor Booth would you try contacting patient to get specifics please? Thank you.

## 2017-11-20 NOTE — Telephone Encounter (Signed)
Left another message asking patient to call office and provide specifics on the side effects she believes her blood pressure medication is causing. Taylor Booth, CMA

## 2017-11-26 MED FILL — $CHANTIX STARTING MONTH BOX: 0.5 MG X 11 | 28 days supply | Qty: 53 | Fill #0

## 2017-12-25 MED FILL — AMLODIPINE BESYLATE 10 MG T: 10 | 30 days supply | Qty: 30 | Fill #1

## 2017-12-28 ENCOUNTER — Ambulatory Visit (INDEPENDENT_AMBULATORY_CARE_PROVIDER_SITE_OTHER): Payer: No Typology Code available for payment source | Admitting: Physician Assistant

## 2017-12-28 MED FILL — ATORVASTATIN CALCIUM 40 MG: 40 | 30 days supply | Qty: 30 | Fill #0

## 2018-01-29 MED FILL — AMLODIPINE BESYLATE 10 MG T: 10 | 30 days supply | Qty: 30 | Fill #2

## 2018-01-29 MED FILL — ATORVASTATIN CALCIUM 40 MG: 40 | 30 days supply | Qty: 30 | Fill #1

## 2018-01-30 ENCOUNTER — Encounter (INDEPENDENT_AMBULATORY_CARE_PROVIDER_SITE_OTHER): Payer: Self-pay | Admitting: Physician Assistant

## 2018-01-30 ENCOUNTER — Ambulatory Visit (INDEPENDENT_AMBULATORY_CARE_PROVIDER_SITE_OTHER): Payer: No Typology Code available for payment source | Admitting: Physician Assistant

## 2018-01-30 ENCOUNTER — Other Ambulatory Visit: Payer: Self-pay

## 2018-01-30 VITALS — BP 137/90 | HR 79 | Temp 98.3°F | Ht 64.5 in | Wt 157.2 lb

## 2018-01-30 DIAGNOSIS — I10 Essential (primary) hypertension: Secondary | ICD-10-CM

## 2018-01-30 DIAGNOSIS — E7841 Elevated Lipoprotein(a): Secondary | ICD-10-CM | POA: Diagnosis not present

## 2018-01-30 DIAGNOSIS — F418 Other specified anxiety disorders: Secondary | ICD-10-CM

## 2018-01-30 DIAGNOSIS — F17200 Nicotine dependence, unspecified, uncomplicated: Secondary | ICD-10-CM | POA: Diagnosis not present

## 2018-01-30 MED ORDER — ESCITALOPRAM OXALATE 10 MG PO TABS: 10.0000 mg | ORAL_TABLET | Freq: Every day | ORAL | 2 refills | Status: DC

## 2018-01-30 MED ORDER — ATORVASTATIN CALCIUM 40 MG PO TABS: 40.0000 mg | ORAL_TABLET | Freq: Every day | ORAL | 6 refills | Status: DC

## 2018-01-30 MED ORDER — AMLODIPINE BESYLATE 10 MG PO TABS: 10.0000 mg | ORAL_TABLET | Freq: Every day | ORAL | 6 refills | Status: DC

## 2018-01-30 MED ORDER — LOSARTAN POTASSIUM 25 MG PO TABS: 25.0000 mg | ORAL_TABLET | Freq: Every day | ORAL | 6 refills | Status: DC

## 2018-01-30 MED ORDER — VARENICLINE TARTRATE 1 MG PO TABS: 1.0000 mg | ORAL_TABLET | Freq: Two times a day (BID) | ORAL | 2 refills | Status: DC

## 2018-01-30 MED FILL — ESCITALOPRAM 10 MG TABLET: 10 | 30 days supply | Qty: 30 | Fill #0

## 2018-01-30 MED FILL — !CHANTIX CONT MONTH BOX: 1 | 28 days supply | Qty: 56 | Fill #0

## 2018-01-30 MED FILL — LOSARTAN POTASSIUM 25 MG TA: 25 | 30 days supply | Qty: 30 | Fill #0

## 2018-01-30 NOTE — Patient Instructions (Signed)
DASH Eating Plan DASH stands for "Dietary Approaches to Stop Hypertension." The DASH eating plan is a healthy eating plan that has been shown to reduce high blood pressure (hypertension). It may also reduce your risk for type 2 diabetes, heart disease, and stroke. The DASH eating plan may also help with weight loss. What are tips for following this plan? General guidelines  Avoid eating more than 2,300 mg (milligrams) of salt (sodium) a day. If you have hypertension, you may need to reduce your sodium intake to 1,500 mg a day.  Limit alcohol intake to no more than 1 drink a day for nonpregnant women and 2 drinks a day for men. One drink equals 12 oz of beer, 5 oz of wine, or 1 oz of hard liquor.  Work with your health care provider to maintain a healthy body weight or to lose weight. Ask what an ideal weight is for you.  Get at least 30 minutes of exercise that causes your heart to beat faster (aerobic exercise) most days of the week. Activities may include walking, swimming, or biking.  Work with your health care provider or diet and nutrition specialist (dietitian) to adjust your eating plan to your individual calorie needs. Reading food labels  Check food labels for the amount of sodium per serving. Choose foods with less than 5 percent of the Daily Value of sodium. Generally, foods with less than 300 mg of sodium per serving fit into this eating plan.  To find whole grains, look for the word "whole" as the first word in the ingredient list. Shopping  Buy products labeled as "low-sodium" or "no salt added."  Buy fresh foods. Avoid canned foods and premade or frozen meals. Cooking  Avoid adding salt when cooking. Use salt-free seasonings or herbs instead of table salt or sea salt. Check with your health care provider or pharmacist before using salt substitutes.  Do not fry foods. Cook foods using healthy methods such as baking, boiling, grilling, and broiling instead.  Cook with  heart-healthy oils, such as olive, canola, soybean, or sunflower oil. Meal planning   Eat a balanced diet that includes: ? 5 or more servings of fruits and vegetables each day. At each meal, try to fill half of your plate with fruits and vegetables. ? Up to 6-8 servings of whole grains each day. ? Less than 6 oz of lean meat, poultry, or fish each day. A 3-oz serving of meat is about the same size as a deck of cards. One egg equals 1 oz. ? 2 servings of low-fat dairy each day. ? A serving of nuts, seeds, or beans 5 times each week. ? Heart-healthy fats. Healthy fats called Omega-3 fatty acids are found in foods such as flaxseeds and coldwater fish, like sardines, salmon, and mackerel.  Limit how much you eat of the following: ? Canned or prepackaged foods. ? Food that is high in trans fat, such as fried foods. ? Food that is high in saturated fat, such as fatty meat. ? Sweets, desserts, sugary drinks, and other foods with added sugar. ? Full-fat dairy products.  Do not salt foods before eating.  Try to eat at least 2 vegetarian meals each week.  Eat more home-cooked food and less restaurant, buffet, and fast food.  When eating at a restaurant, ask that your food be prepared with less salt or no salt, if possible. What foods are recommended? The items listed may not be a complete list. Talk with your dietitian about what   dietary choices are best for you. Grains Whole-grain or whole-wheat bread. Whole-grain or whole-wheat pasta. Brown rice. Oatmeal. Quinoa. Bulgur. Whole-grain and low-sodium cereals. Pita bread. Low-fat, low-sodium crackers. Whole-wheat flour tortillas. Vegetables Fresh or frozen vegetables (raw, steamed, roasted, or grilled). Low-sodium or reduced-sodium tomato and vegetable juice. Low-sodium or reduced-sodium tomato sauce and tomato paste. Low-sodium or reduced-sodium canned vegetables. Fruits All fresh, dried, or frozen fruit. Canned fruit in natural juice (without  added sugar). Meat and other protein foods Skinless chicken or turkey. Ground chicken or turkey. Pork with fat trimmed off. Fish and seafood. Egg whites. Dried beans, peas, or lentils. Unsalted nuts, nut butters, and seeds. Unsalted canned beans. Lean cuts of beef with fat trimmed off. Low-sodium, lean deli meat. Dairy Low-fat (1%) or fat-free (skim) milk. Fat-free, low-fat, or reduced-fat cheeses. Nonfat, low-sodium ricotta or cottage cheese. Low-fat or nonfat yogurt. Low-fat, low-sodium cheese. Fats and oils Soft margarine without trans fats. Vegetable oil. Low-fat, reduced-fat, or light mayonnaise and salad dressings (reduced-sodium). Canola, safflower, olive, soybean, and sunflower oils. Avocado. Seasoning and other foods Herbs. Spices. Seasoning mixes without salt. Unsalted popcorn and pretzels. Fat-free sweets. What foods are not recommended? The items listed may not be a complete list. Talk with your dietitian about what dietary choices are best for you. Grains Baked goods made with fat, such as croissants, muffins, or some breads. Dry pasta or rice meal packs. Vegetables Creamed or fried vegetables. Vegetables in a cheese sauce. Regular canned vegetables (not low-sodium or reduced-sodium). Regular canned tomato sauce and paste (not low-sodium or reduced-sodium). Regular tomato and vegetable juice (not low-sodium or reduced-sodium). Pickles. Olives. Fruits Canned fruit in a light or heavy syrup. Fried fruit. Fruit in cream or butter sauce. Meat and other protein foods Fatty cuts of meat. Ribs. Fried meat. Bacon. Sausage. Bologna and other processed lunch meats. Salami. Fatback. Hotdogs. Bratwurst. Salted nuts and seeds. Canned beans with added salt. Canned or smoked fish. Whole eggs or egg yolks. Chicken or turkey with skin. Dairy Whole or 2% milk, cream, and half-and-half. Whole or full-fat cream cheese. Whole-fat or sweetened yogurt. Full-fat cheese. Nondairy creamers. Whipped toppings.  Processed cheese and cheese spreads. Fats and oils Butter. Stick margarine. Lard. Shortening. Ghee. Bacon fat. Tropical oils, such as coconut, palm kernel, or palm oil. Seasoning and other foods Salted popcorn and pretzels. Onion salt, garlic salt, seasoned salt, table salt, and sea salt. Worcestershire sauce. Tartar sauce. Barbecue sauce. Teriyaki sauce. Soy sauce, including reduced-sodium. Steak sauce. Canned and packaged gravies. Fish sauce. Oyster sauce. Cocktail sauce. Horseradish that you find on the shelf. Ketchup. Mustard. Meat flavorings and tenderizers. Bouillon cubes. Hot sauce and Tabasco sauce. Premade or packaged marinades. Premade or packaged taco seasonings. Relishes. Regular salad dressings. Where to find more information:  National Heart, Lung, and Blood Institute: www.nhlbi.nih.gov  American Heart Association: www.heart.org Summary  The DASH eating plan is a healthy eating plan that has been shown to reduce high blood pressure (hypertension). It may also reduce your risk for type 2 diabetes, heart disease, and stroke.  With the DASH eating plan, you should limit salt (sodium) intake to 2,300 mg a day. If you have hypertension, you may need to reduce your sodium intake to 1,500 mg a day.  When on the DASH eating plan, aim to eat more fresh fruits and vegetables, whole grains, lean proteins, low-fat dairy, and heart-healthy fats.  Work with your health care provider or diet and nutrition specialist (dietitian) to adjust your eating plan to your individual   calorie needs. This information is not intended to replace advice given to you by your health care provider. Make sure you discuss any questions you have with your health care provider. Document Released: 04/20/2011 Document Revised: 04/24/2016 Document Reviewed: 04/24/2016 Elsevier Interactive Patient Education  2018 Else[employer] Inc[employer]  

## 2018-01-30 NOTE — Progress Notes (Signed)
Subjective:  Patient ID: Taylor Booth, female    DOB: Aug 16, 1975  Age: 42 y.o. MRN: [id number]  CC: HTN  HPI Taylor Regalandra Mcneese is a 42 y.o. female with a medical history of s/p BTL, bunion removal, depression, subclinical hypothyroidism, and tobacco use disorder presents to f/u on hypertension. Last BP 164/91 mmHg. Prescribed amlodipine 10 mg which she takes as directed. BP 137/90 mmHg today. Says she has also stopped smoking with the help of Chantix; 48 days without smoking. Finished Chantix as she thought she only had one month to take. Still has urges to smoke. Pt says escitalopram has been helping her deal with her stressors. Feels she is more resistant to her stressors and does not worry as much about "things I can not control". Would like to continue taking escitalopram 10 mg. Does not endorse any other symptoms or complaints.     Outpatient Medications Prior to Visit  Medication Sig Dispense Refill  . amLODipine (NORVASC) 10 MG tablet Take 1 tablet (10 mg total) by mouth daily. 30 tablet 5  . atorvastatin (LIPITOR) 40 MG tablet Take 1 tablet (40 mg total) by mouth daily. 90 tablet 1  . escitalopram (LEXAPRO) 10 MG tablet Take 1 tablet (10 mg total) by mouth daily. 30 tablet 2  . ferrous sulfate 325 (65 FE) MG EC tablet Take 1 tablet (325 mg total) by mouth 3 (three) times daily with meals. 90 tablet 1  . varenicline (CHANTIX STARTING MONTH PAK) 0.5 MG X 11 & 1 MG X 42 tablet Take one 0.5 mg tablet by mouth once daily for 3 days, then increase to one 0.5 mg tablet twice daily for 4 days, then increase to one 1 mg tablet twice daily. 53 tablet 0   No facility-administered medications prior to visit.      ROS Review of Systems  Constitutional: Negative for chills, fever and malaise/fatigue.  Eyes: Negative for blurred vision.  Respiratory: Negative for shortness of breath.   Cardiovascular: Negative for chest pain and palpitations.  Gastrointestinal: Negative for abdominal pain and  nausea.  Genitourinary: Negative for dysuria and hematuria.  Musculoskeletal: Negative for joint pain and myalgias.  Skin: Negative for rash.  Neurological: Negative for tingling and headaches.  Psychiatric/Behavioral: Negative for depression. The patient is not nervous/anxious.     Objective:  BP 137/90 (BP Location: Left Arm, Patient Position: Sitting, Cuff Size: Normal)   Pulse 79   Temp 98.3 F (36.8 C) (Oral)   Ht 5' 4.5" (1.638 m)   Wt 157 lb 3.2 oz (71.3 kg)   LMP 01/16/2018 (Approximate)   SpO2 100%   BMI 26.57 kg/m   BP/Weight 01/30/2018 11/02/2017 10/29/2017  Systolic BP 137 164 159  Diastolic BP 90 91 97  Wt. (Lbs) 157.2 - 157.8  BMI 26.57 - 26.67      Physical Exam  Constitutional: She is oriented to person, place, and time.  Well developed, well nourished, NAD, polite  HENT:  Head: Normocephalic and atraumatic.  Eyes: No scleral icterus.  Neck: Normal range of motion. Neck supple. No thyromegaly present.  Cardiovascular: Normal rate, regular rhythm and normal heart sounds.  Pulmonary/Chest: Effort normal and breath sounds normal.  Musculoskeletal: She exhibits no edema.  Neurological: She is alert and oriented to person, place, and time.  Skin: Skin is warm and dry. No rash noted. No erythema. No pallor.  Psychiatric: She has a normal mood and affect. Her behavior is normal. Thought content normal.  Vitals reviewed.  Assessment & Plan:   1. Hypertension, unspecified type - Refill amLODipine (NORVASC) 10 MG tablet; Take 1 tablet (10 mg total) by mouth daily.  Dispense: 30 tablet; Refill: 6 - Begin losartan (COZAAR) 25 MG tablet; Take 1 tablet (25 mg total) by mouth daily.  Dispense: 30 tablet; Refill: 6  2. Tobacco use disorder - Refill varenicline (CHANTIX CONTINUING MONTH PAK) 1 MG tablet; Take 1 tablet (1 mg total) by mouth 2 (two) times daily.  Dispense: 60 tablet; Refill: 2  3. Depression with anxiety - Refill escitalopram (LEXAPRO) 10 MG  tablet; Take 1 tablet (10 mg total) by mouth daily.  Dispense: 30 tablet; Refill: 2  4. Elevated lipoprotein(a) - Refill atorvastatin (LIPITOR) 40 MG tablet; Take 1 tablet (40 mg total) by mouth daily.  Dispense: 30 tablet; Refill: 6   Meds ordered this encounter  Medications  . varenicline (CHANTIX CONTINUING MONTH PAK) 1 MG tablet    Sig: Take 1 tablet (1 mg total) by mouth 2 (two) times daily.    Dispense:  60 tablet    Refill:  2    Order Specific Question:   Supervising Provider    Answer:   Hoy Register [[id number]]  . escitalopram (LEXAPRO) 10 MG tablet    Sig: Take 1 tablet (10 mg total) by mouth daily.    Dispense:  30 tablet    Refill:  2    Order Specific Question:   Supervising Provider    Answer:   Hoy Register [[id number]]  . atorvastatin (LIPITOR) 40 MG tablet    Sig: Take 1 tablet (40 mg total) by mouth daily.    Dispense:  30 tablet    Refill:  6    Order Specific Question:   Supervising Provider    Answer:   Hoy Register [[id number]]  . amLODipine (NORVASC) 10 MG tablet    Sig: Take 1 tablet (10 mg total) by mouth daily.    Dispense:  30 tablet    Refill:  6    Order Specific Question:   Supervising Provider    Answer:   Hoy Register [[id number]]  . losartan (COZAAR) 25 MG tablet    Sig: Take 1 tablet (25 mg total) by mouth daily.    Dispense:  30 tablet    Refill:  6    Order Specific Question:   Supervising Provider    Answer:   Hoy Register [[id number]]    Follow-up: Return in about 3 months (around 05/01/2018) for HTN, subclinical thyroidism, PAP, depression/anxiety.   Loletta Specter PA

## 2018-03-13 MED FILL — AMLODIPINE BESYLATE 10 MG T: 10 | 30 days supply | Qty: 30 | Fill #3

## 2018-03-13 MED FILL — ATORVASTATIN CALCIUM 40 MG: 40 | 30 days supply | Qty: 30 | Fill #2

## 2018-03-13 MED FILL — ESCITALOPRAM 10 MG TABLET: 10 | 30 days supply | Qty: 30 | Fill #1

## 2018-03-13 MED FILL — LOSARTAN POTASSIUM 25 MG TA: 25 | 30 days supply | Qty: 30 | Fill #1

## 2018-04-19 MED FILL — ATORVASTATIN CALCIUM 40 MG: 40 | 30 days supply | Qty: 30 | Fill #3

## 2018-04-19 MED FILL — ESCITALOPRAM 10 MG TABLET: 10 | 30 days supply | Qty: 30 | Fill #2

## 2018-04-19 MED FILL — AMLODIPINE BESYLATE 10 MG T: 10 | 30 days supply | Qty: 30 | Fill #4

## 2018-04-19 MED FILL — LOSARTAN POTASSIUM 25 MG TA: 25 | 30 days supply | Qty: 30 | Fill #2

## 2018-04-30 ENCOUNTER — Ambulatory Visit (INDEPENDENT_AMBULATORY_CARE_PROVIDER_SITE_OTHER): Payer: No Typology Code available for payment source | Admitting: Physician Assistant

## 2018-05-15 DIAGNOSIS — D649 Anemia, unspecified: Secondary | ICD-10-CM

## 2018-05-15 DIAGNOSIS — N189 Chronic kidney disease, unspecified: Secondary | ICD-10-CM

## 2018-05-15 HISTORY — DX: Anemia, unspecified: D64.9

## 2018-05-15 HISTORY — PX: RENAL BIOPSY: SHX156

## 2018-05-15 HISTORY — DX: Chronic kidney disease, unspecified: N18.9

## 2018-06-20 MED FILL — AMLODIPINE BESYLATE 10 MG T: 10 | 30 days supply | Qty: 30 | Fill #5

## 2018-07-03 ENCOUNTER — Ambulatory Visit: Payer: BLUE CROSS/BLUE SHIELD | Admitting: Medical

## 2018-07-03 ENCOUNTER — Encounter: Payer: Self-pay | Admitting: Medical

## 2018-07-03 VITALS — BP 146/90 | HR 84 | Temp 98.0°F | Resp 16 | Ht 64.5 in | Wt 164.6 lb

## 2018-07-03 DIAGNOSIS — R609 Edema, unspecified: Secondary | ICD-10-CM | POA: Diagnosis not present

## 2018-07-03 DIAGNOSIS — Z113 Encounter for screening for infections with a predominantly sexual mode of transmission: Secondary | ICD-10-CM | POA: Diagnosis not present

## 2018-07-03 DIAGNOSIS — R011 Cardiac murmur, unspecified: Secondary | ICD-10-CM | POA: Diagnosis not present

## 2018-07-03 DIAGNOSIS — I10 Essential (primary) hypertension: Secondary | ICD-10-CM | POA: Diagnosis not present

## 2018-07-03 DIAGNOSIS — Z7185 Encounter for immunization safety counseling: Secondary | ICD-10-CM | POA: Insufficient documentation

## 2018-07-03 DIAGNOSIS — Z0000 Encounter for general adult medical examination without abnormal findings: Secondary | ICD-10-CM | POA: Insufficient documentation

## 2018-07-03 DIAGNOSIS — Z7189 Other specified counseling: Secondary | ICD-10-CM

## 2018-07-03 DIAGNOSIS — D649 Anemia, unspecified: Secondary | ICD-10-CM | POA: Insufficient documentation

## 2018-07-03 DIAGNOSIS — Z1239 Encounter for other screening for malignant neoplasm of breast: Secondary | ICD-10-CM

## 2018-07-03 LAB — POCT URINALYSIS DIP (PROADVANTAGE DEVICE)
Bilirubin, UA: NEGATIVE
Glucose, UA: NEGATIVE mg/dL
Ketones, POC UA: NEGATIVE mg/dL
LEUKOCYTES UA: NEGATIVE
NITRITE UA: NEGATIVE
PH UA: 6 (ref 5.0–8.0)
Specific Gravity, Urine: 1.015
UUROB: NEGATIVE

## 2018-07-03 NOTE — Progress Notes (Signed)
Subjective:   HPI  Taylor Booth is a 43 y.o. female who presents for Chief Complaint  Patient presents with  . NP    NP non fasting CPE      Medical care team includes: , Kermit Balo, PA-C here for primary care Dentist Eye doctor  Concerns: New patient today.  She has a history of high blood pressure high cholesterol both diagnosed last year.  Started on medicine last year.  Did not tolerate 1 medicine initially.  She feels like she cannot tolerate her current 2 medications as she has had swelling in her lower extremities the last 2 weeks, feels like her temperature is off at times.  She is past due on Pap smear but declines this today wants to come back on a separate date.  She has a boyfriend, no specific concern for STD or symptoms today.  She was a smoker for 10 years but stopped this past year with Chantix   Past Medical History:  Diagnosis Date  . Allergy   . Anemia   . Depression 10/2017  . Hyperlipidemia 10/2017  . Hypertension 10/2017  . Wears glasses     Past Surgical History:  Procedure Laterality Date  . CESAREAN SECTION     x3, 2003, 1997, 1991    Social History   Socioeconomic History  . Marital status: Married    Spouse name: Not on file  . Number of children: Not on file  . Years of education: Not on file  . Highest education level: Not on file  Occupational History  . Not on file  Social Needs  . Financial resource strain: Not on file  . Food insecurity:    Worry: Not on file    Inability: Not on file  . Transportation needs:    Medical: Not on file    Non-medical: Not on file  Tobacco Use  . Smoking status: Former Smoker    Packs/day: 0.50    Years: 10.00    Pack years: 5.00    Types: Cigarettes    Last attempt to quit: 07/03/2017    Years since quitting: 1.0  . Smokeless tobacco: Never Used  . Tobacco comment: quit 48 days ago  Substance and Sexual Activity  . Alcohol use: Yes    Alcohol/week: 2.0 standard drinks   Types: 2 Glasses of wine per week  . Drug use: Never  . Sexual activity: Yes  Lifestyle  . Physical activity:    Days per week: Not on file    Minutes per session: Not on file  . Stress: Not on file  Relationships  . Social connections:    Talks on phone: Not on file    Gets together: Not on file    Attends religious service: Not on file    Active member of club or organization: Not on file    Attends meetings of clubs or organizations: Not on file    Relationship status: Not on file  . Intimate partner violence:    Fear of current or ex partner: Not on file    Emotionally abused: Not on file    Physically abused: Not on file    Forced sexual activity: Not on file  Other Topics Concern  . Not on file  Social History Narrative   Has boyfriend.  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 06/2018    Family History  Problem Relation Age of  Onset  . Diabetes Mother   . Cancer Father        liver  . Diabetes Maternal Grandmother      Current Outpatient Medications:  .  amLODipine (NORVASC) 10 MG tablet, Take 1 tablet (10 mg total) by mouth daily., Disp: 30 tablet, Rfl: 6 .  atorvastatin (LIPITOR) 40 MG tablet, Take 1 tablet (40 mg total) by mouth daily., Disp: 30 tablet, Rfl: 6 .  escitalopram (LEXAPRO) 10 MG tablet, Take 1 tablet (10 mg total) by mouth daily., Disp: 30 tablet, Rfl: 2 .  losartan (COZAAR) 25 MG tablet, Take 1 tablet (25 mg total) by mouth daily., Disp: 30 tablet, Rfl: 6  Allergies  Allergen Reactions  . Penicillins Nausea And Vomiting    Reviewed their medical, surgical, family, social, medication, and allergy history and updated chart as appropriate.   Review of Systems Constitutional: -fever, -chills, -sweats, -unexpected weight change, -decreased appetite, -fatigue Allergy: -sneezing, -itching, -congestion Dermatology: -changing moles, --rash,  -lumps ENT: -runny nose, -ear pain, -sore throat, -hoarseness, -sinus pain, -teeth pain, - ringing in ears, -hearing loss, -nosebleeds Cardiology: -chest pain, -palpitations, -swelling, -difficulty breathing when lying flat, -waking up short of breath Respiratory: -cough, -shortness of breath, -difficulty breathing with exercise or exertion, -wheezing, -coughing up blood Gastroenterology: -abdominal pain, -nausea, -vomiting, -diarrhea, -constipation, -blood in stool, -changes in bowel movement, -difficulty swallowing or eating Hematology: -bleeding, -bruising  Musculoskeletal: -joint aches, -muscle aches, -joint swelling, -back pain, -neck pain, -cramping, -changes in gait Ophthalmology: denies vision changes, eye redness, itching, discharge Urology: -burning with urination, -difficulty urinating, -blood in urine, -urinary frequency, -urgency, -incontinence Neurology: -headache, -weakness, -tingling, -numbness, -memory loss, -falls, -dizziness Psychology: -depressed mood, -agitation, -sleep problems Breast/gyn: -breast tenderness, -discharge, -lumps, -vaginal discharge,- irregular periods, -heavy periods      Objective:  BP (!) 146/90   Pulse 84   Temp 98 F (36.7 C) (Oral)   Resp 16   Ht 5' 4.5" (1.638 m)   Wt 164 lb 9.6 oz (74.7 kg)   LMP 06/19/2018 (Exact Date)   SpO2 99%   BMI 27.82 kg/m   General appearance: alert, no distress, WD/WN, African American female Skin: unremarkable HEENT: normocephalic, conjunctiva/corneas normal, sclerae anicteric, PERRLA, EOMi, nares patent, no discharge or erythema, pharynx normal Oral cavity: MMM, tongue normal, teeth in good repair Neck: supple, no lymphadenopathy,possible bilat thyromegaly, mild, can't rule out nodule on exam, no masses, normal ROM, no bruits Chest: 2/6 holosystolic murmur heard in bilat upper sternal border, non tender, normal shape and expansion Heart: RRR, normal S1, S2, no murmurs Lungs: CTA bilaterally, no wheezes,  rhonchi, or rales Abdomen: +bs, soft, likely diathesis recti without true hernia upper central abdomen, otherwise non tender, non distended, no masses, no hepatomegaly, no splenomegaly, no bruits Back: non tender, normal ROM, no scoliosis Musculoskeletal: +surgical scar right great toe dorsally from prior bunion surgery, otherwise upper extremities non tender, no obvious deformity, normal ROM throughout, lower extremities non tender, no obvious deformity, normal ROM throughout Extremities: 2+ bilat LE nonpitting edema, no cyanosis, no clubbing Pulses: 2+ symmetric, upper and lower extremities, normal cap refill Neurological: alert, oriented x 3, CN2-12 intact, strength normal upper extremities and lower extremities, sensation normal throughout, DTRs 2+ throughout, no cerebellar signs, gait normal Psychiatric: normal affect, behavior normal, pleasant  Breast/gyn/rectal - deferred, she will return for separate visit for this  EKG Physical, high blood pressure, rate 78 bpm, PR interval 184 ms, QRS duration 82 ms, QTC 460 ms, axis 52 degrees, normal  sinus rhythm, no prior EKG    Assessment and Plan :   Encounter Diagnoses  Name Primary?  . Routine general medical examination at a health care facility Yes  . Essential hypertension, benign   . Edema, unspecified type   . Heart murmur   . Screen for STD (sexually transmitted disease)   . Screening for breast cancer   . Anemia, unspecified type   . Vaccine counseling     Physical exam - discussed and counseled on healthy lifestyle, diet, exercise, preventative care, vaccinations, sick and well care, proper use of emergency dept and after hours care, and addressed their concerns.    Health screening: Advised they see their eye doctor yearly for routine vision care. Advised they see their dentist yearly for routine dental care including hygiene visits twice yearly.  Discussed STD testing, discussed prevention, condom use, means of  transmission  Cancer screening Counseled on self breast exams, mammograms, cervical cancer screening  She will return for breast/pelvic/pap exam  Vaccinations: Advised yearly influenza vaccine Advised up to date Tdap.   She will consider vaccination  Separate significant issues discussed: I reviewed labs in the computer from June 2019 showing anemia, low TSH but normal T4, elevated cholesterol.  On exam today she appears to have a murmur, there is possible thyroid nodule, there is definite swelling in the lower extremities.  We discussed the fact that she will likely need some additional testing once we get back today's labs.  I suspect underlying thyroid issues.  Likely referral to endocrinology.  Giovannina was seen today for np.  Diagnoses and all orders for this visit:  Routine general medical examination at a health care facility -     POCT Urinalysis DIP (Proadvantage Device) -     Comprehensive metabolic panel -     CBC with Differential/Platelet -     Lipid panel -     TSH -     T4, free -     HIV Antibody (routine testing w rflx) -     GC/Chlamydia Probe Amp -     RPR -     EKG 12-Lead -     MM DIGITAL SCREENING BILATERAL; Future  Essential hypertension, benign  Edema, unspecified type  Heart murmur  Screen for STD (sexually transmitted disease) -     HIV Antibody (routine testing w rflx) -     GC/Chlamydia Probe Amp -     RPR  Screening for breast cancer -     MM DIGITAL SCREENING BILATERAL; Future  Anemia, unspecified type  Vaccine counseling   Follow-up pending labs, yearly for physical

## 2018-07-04 ENCOUNTER — Ambulatory Visit (INDEPENDENT_AMBULATORY_CARE_PROVIDER_SITE_OTHER): Payer: BLUE CROSS/BLUE SHIELD | Admitting: Medical

## 2018-07-04 ENCOUNTER — Encounter: Payer: Self-pay | Admitting: Medical

## 2018-07-04 ENCOUNTER — Other Ambulatory Visit: Payer: Self-pay | Admitting: Medical

## 2018-07-04 VITALS — BP 130/88 | HR 90 | Temp 98.5°F | Resp 16 | Ht 64.5 in | Wt 164.0 lb

## 2018-07-04 DIAGNOSIS — I10 Essential (primary) hypertension: Secondary | ICD-10-CM | POA: Diagnosis not present

## 2018-07-04 DIAGNOSIS — R809 Proteinuria, unspecified: Secondary | ICD-10-CM | POA: Insufficient documentation

## 2018-07-04 DIAGNOSIS — E079 Disorder of thyroid, unspecified: Secondary | ICD-10-CM

## 2018-07-04 DIAGNOSIS — R609 Edema, unspecified: Secondary | ICD-10-CM | POA: Diagnosis not present

## 2018-07-04 DIAGNOSIS — D649 Anemia, unspecified: Secondary | ICD-10-CM

## 2018-07-04 DIAGNOSIS — R7989 Other specified abnormal findings of blood chemistry: Secondary | ICD-10-CM

## 2018-07-04 DIAGNOSIS — N921 Excessive and frequent menstruation with irregular cycle: Secondary | ICD-10-CM | POA: Insufficient documentation

## 2018-07-04 DIAGNOSIS — E8809 Other disorders of plasma-protein metabolism, not elsewhere classified: Secondary | ICD-10-CM

## 2018-07-04 DIAGNOSIS — R011 Cardiac murmur, unspecified: Secondary | ICD-10-CM

## 2018-07-04 LAB — LIPID PANEL
Chol/HDL Ratio: 5.3 ratio — ABNORMAL HIGH (ref 0.0–4.4)
Cholesterol, Total: 345 mg/dL — ABNORMAL HIGH (ref 100–199)
HDL: 65 mg/dL (ref >39)
LDL CALC: 248 mg/dL — AB (ref 0–99)
Triglycerides: 159 mg/dL — ABNORMAL HIGH (ref 0–149)
VLDL Cholesterol Cal: 32 mg/dL (ref 5–40)

## 2018-07-04 LAB — HIV ANTIBODY (ROUTINE TESTING W REFLEX): HIV Screen 4th Generation wRfx: NONREACTIVE

## 2018-07-04 LAB — CBC WITH DIFFERENTIAL/PLATELET
Basophils Absolute: 0 10*3/uL (ref 0.0–0.2)
Basos: 0 %
EOS (ABSOLUTE): 0.2 10*3/uL (ref 0.0–0.4)
Eos: 2 %
HEMATOCRIT: 23.5 % — AB (ref 34.0–46.6)
Hemoglobin: 7 g/dL — CL (ref 11.1–15.9)
IMMATURE GRANS (ABS): 0 10*3/uL (ref 0.0–0.1)
IMMATURE GRANULOCYTES: 0 %
LYMPHS: 31 %
Lymphocytes Absolute: 2 10*3/uL (ref 0.7–3.1)
MCH: 22.2 pg — ABNORMAL LOW (ref 26.6–33.0)
MCHC: 29.8 g/dL — ABNORMAL LOW (ref 31.5–35.7)
MCV: 75 fL — ABNORMAL LOW (ref 79–97)
MONOCYTES: 12 %
MONOS ABS: 0.8 10*3/uL (ref 0.1–0.9)
NEUTROS PCT: 55 %
Neutrophils Absolute: 3.6 10*3/uL (ref 1.4–7.0)
Platelets: 724 10*3/uL — ABNORMAL HIGH (ref 150–450)
RBC: 3.15 x10E6/uL — AB (ref 3.77–5.28)
RDW: 17.6 % — AB (ref 11.7–15.4)
WBC: 6.6 10*3/uL (ref 3.4–10.8)

## 2018-07-04 LAB — COMPREHENSIVE METABOLIC PANEL
ALK PHOS: 89 IU/L (ref 39–117)
ALT: 10 IU/L (ref 0–32)
AST: 18 IU/L (ref 0–40)
Albumin/Globulin Ratio: 1 — ABNORMAL LOW (ref 1.2–2.2)
Albumin: 2.1 g/dL — ABNORMAL LOW (ref 3.8–4.8)
BUN/Creatinine Ratio: 12 (ref 9–23)
BUN: 8 mg/dL (ref 6–24)
Bilirubin Total: <0.2 mg/dL (ref 0.0–1.2)
CALCIUM: 7.7 mg/dL — AB (ref 8.7–10.2)
CO2: 23 mmol/L (ref 20–29)
CREATININE: 0.67 mg/dL (ref 0.57–1.00)
Chloride: 103 mmol/L (ref 96–106)
GFR calc Af Amer: 124 mL/min/{1.73_m2} (ref >59)
GFR calc non Af Amer: 107 mL/min/{1.73_m2} (ref >59)
GLUCOSE: 88 mg/dL (ref 65–99)
Globulin, Total: 2.1 g/dL (ref 1.5–4.5)
Potassium: 4 mmol/L (ref 3.5–5.2)
SODIUM: 137 mmol/L (ref 134–144)
Total Protein: 4.2 g/dL — CL (ref 6.0–8.5)

## 2018-07-04 LAB — RPR: RPR Ser Ql: NONREACTIVE

## 2018-07-04 LAB — TSH: TSH: 0.485 u[IU]/mL (ref 0.450–4.500)

## 2018-07-04 LAB — T4, FREE: Free T4: 0.93 ng/dL (ref 0.82–1.77)

## 2018-07-04 LAB — POCT URINE PREGNANCY: Preg Test, Ur: NEGATIVE

## 2018-07-04 NOTE — Progress Notes (Signed)
Subjective: Chief Complaint  Patient presents with  . consult    discuss lab results   Here to discuss labs from yesterday's physical visit.  Of note she eats meat on a regular basis, gets a variety of vegetables and starches, eats 3 times a day, drinks plenty of water, does get some fried food.  She denies history of anemia no history of iron deficiency, no heavy bleeding, periods are regular last menstrual period last week.  No history of thyroid disease himself or family.  No history of low protein or protein in the urine.   Objective: BP 130/88   Pulse 90   Temp 98.5 F (36.9 C) (Oral)   Resp 16   Ht 5' 4.5" (1.638 m)   Wt 164 lb (74.4 kg)   LMP 06/19/2018 (Exact Date)   SpO2 99%   BMI 27.72 kg/m   Gen: wd, wn, nad   Assessment: Encounter Diagnoses  Name Primary?  . Edema, unspecified type Yes  . Essential hypertension, benign   . Anemia, unspecified type   . Hypoalbuminemia   . Thyroid mass   . Heart murmur   . Abnormal thyroid blood test   . Proteinuria, unspecified type     Plan: We discussed her labs from yesterday with abnormalities  Edema-most likely due to oncotic pressure changes given the low protein.  Low protein in serum, proteinuria findings- unclear cause.  Discussed case with supervising physician Dr. Susann Givens.  We will start with SPEP and UPEP.  Heart murmur-likely due to anemia, but follow in case we need to do an ultrasound  Thyroid mass, abnormal thyroid labs-set up for thyroid ultrasound  Anemia with hemoglobin 7 gradually down from 9 from June 2019.  Pending labs today, may need transfusion.  May need iron supplementation.  Will refer to hematology   Taylor Booth was seen today for consult.  Diagnoses and all orders for this visit:  Edema, unspecified type -     UPEP/TP, 24-Hr Urine -     POCT urine pregnancy  Essential hypertension, benign -     UPEP/TP, 24-Hr Urine  Anemia, unspecified type -     UPEP/TP, 24-Hr Urine -     POCT  urine pregnancy -     Ambulatory referral to Hematology  Hypoalbuminemia -     UPEP/TP, 24-Hr Urine -     Ambulatory referral to Hematology  Thyroid mass -     UPEP/TP, 24-Hr Urine -     Ambulatory referral to Hematology  Heart murmur  Abnormal thyroid blood test -     Ambulatory referral to Hematology  Proteinuria, unspecified type

## 2018-07-05 ENCOUNTER — Other Ambulatory Visit: Payer: Self-pay | Admitting: Medical

## 2018-07-05 LAB — GC/CHLAMYDIA PROBE AMP
Chlamydia trachomatis, NAA: NEGATIVE
Neisseria gonorrhoeae by PCR: NEGATIVE

## 2018-07-05 MED ORDER — FERROUS GLUCONATE 324 (38 FE) MG PO TABS: 324.0000 mg | ORAL_TABLET | Freq: Two times a day (BID) | ORAL | 3 refills | Status: DC

## 2018-07-06 LAB — PROTEIN ELECTROPHORESIS
A/G Ratio: 0.8 (ref 0.7–1.7)
Albumin ELP: 1.8 g/dL — ABNORMAL LOW (ref 2.9–4.4)
Alpha 1: 0.2 g/dL (ref 0.0–0.4)
Alpha 2: 0.7 g/dL (ref 0.4–1.0)
Beta: 0.9 g/dL (ref 0.7–1.3)
GAMMA GLOBULIN: 0.5 g/dL (ref 0.4–1.8)
Globulin, Total: 2.4 g/dL (ref 2.2–3.9)
TOTAL PROTEIN: 4.2 g/dL — AB (ref 6.0–8.5)

## 2018-07-06 LAB — IRON: IRON: 11 ug/dL — AB (ref 27–159)

## 2018-07-06 LAB — SPECIMEN STATUS REPORT

## 2018-07-07 DIAGNOSIS — E8809 Other disorders of plasma-protein metabolism, not elsewhere classified: Secondary | ICD-10-CM | POA: Diagnosis not present

## 2018-07-07 DIAGNOSIS — R609 Edema, unspecified: Secondary | ICD-10-CM | POA: Diagnosis not present

## 2018-07-07 DIAGNOSIS — D649 Anemia, unspecified: Secondary | ICD-10-CM | POA: Diagnosis not present

## 2018-07-07 DIAGNOSIS — I10 Essential (primary) hypertension: Secondary | ICD-10-CM | POA: Diagnosis not present

## 2018-07-09 LAB — UPEP/TP, 24-HR URINE
Albumin, U: 46.8 %
Alpha 1, Urine: 12.2 %
Alpha 2, Urine: 13.3 %
Beta, Urine: 14.4 %
Gamma Globulin, Urine: 13.3 %
Protein, 24H Urine: 8895 mg/24 hr — ABNORMAL HIGH (ref 30–150)
Protein, Ur: 1186 mg/dL

## 2018-07-11 ENCOUNTER — Other Ambulatory Visit: Payer: Self-pay | Admitting: *Deleted

## 2018-07-11 ENCOUNTER — Telehealth: Payer: Self-pay | Admitting: Oncology

## 2018-07-11 DIAGNOSIS — D649 Anemia, unspecified: Secondary | ICD-10-CM

## 2018-07-11 MED FILL — ATORVASTATIN CALCIUM 40 MG: 40 | 30 days supply | Qty: 30 | Fill #0

## 2018-07-11 MED FILL — LOSARTAN POTASSIUM 25 MG TA: 25 | 30 days supply | Qty: 30 | Fill #3

## 2018-07-11 NOTE — Telephone Encounter (Signed)
New hem appt scheduled for the pt to see Dr. Truett Perna on 2/29 at 930am w/labs at 915am.

## 2018-07-12 ENCOUNTER — Other Ambulatory Visit: Payer: Self-pay | Admitting: Nurse Practitioner

## 2018-07-12 ENCOUNTER — Other Ambulatory Visit: Payer: Self-pay

## 2018-07-12 DIAGNOSIS — D649 Anemia, unspecified: Secondary | ICD-10-CM

## 2018-07-12 DIAGNOSIS — R809 Proteinuria, unspecified: Secondary | ICD-10-CM

## 2018-07-13 ENCOUNTER — Inpatient Hospital Stay: Payer: BLUE CROSS/BLUE SHIELD | Attending: Oncology | Admitting: Oncology

## 2018-07-13 ENCOUNTER — Telehealth: Payer: Self-pay | Admitting: Oncology

## 2018-07-13 ENCOUNTER — Inpatient Hospital Stay: Payer: BLUE CROSS/BLUE SHIELD

## 2018-07-13 VITALS — BP 155/81 | HR 84 | Temp 98.3°F | Resp 18 | Ht 64.5 in | Wt 160.6 lb

## 2018-07-13 DIAGNOSIS — D649 Anemia, unspecified: Secondary | ICD-10-CM

## 2018-07-13 DIAGNOSIS — D509 Iron deficiency anemia, unspecified: Secondary | ICD-10-CM | POA: Insufficient documentation

## 2018-07-13 DIAGNOSIS — I10 Essential (primary) hypertension: Secondary | ICD-10-CM | POA: Diagnosis not present

## 2018-07-13 DIAGNOSIS — E785 Hyperlipidemia, unspecified: Secondary | ICD-10-CM | POA: Insufficient documentation

## 2018-07-13 DIAGNOSIS — F329 Major depressive disorder, single episode, unspecified: Secondary | ICD-10-CM | POA: Insufficient documentation

## 2018-07-13 DIAGNOSIS — N049 Nephrotic syndrome with unspecified morphologic changes: Secondary | ICD-10-CM | POA: Diagnosis not present

## 2018-07-13 DIAGNOSIS — Z87891 Personal history of nicotine dependence: Secondary | ICD-10-CM | POA: Diagnosis not present

## 2018-07-13 LAB — CBC WITH DIFFERENTIAL (CANCER CENTER ONLY)
Abs Immature Granulocytes: 0.02 10*3/uL (ref 0.00–0.07)
Basophils Absolute: 0 10*3/uL (ref 0.0–0.1)
Basophils Relative: 1 %
EOS ABS: 0.2 10*3/uL (ref 0.0–0.5)
Eosinophils Relative: 3 %
HCT: 24.3 % — ABNORMAL LOW (ref 36.0–46.0)
Hemoglobin: 7.3 g/dL — ABNORMAL LOW (ref 12.0–15.0)
Immature Granulocytes: 0 %
Lymphocytes Relative: 30 %
Lymphs Abs: 1.9 10*3/uL (ref 0.7–4.0)
MCH: 21.6 pg — ABNORMAL LOW (ref 26.0–34.0)
MCHC: 30 g/dL (ref 30.0–36.0)
MCV: 71.9 fL — ABNORMAL LOW (ref 80.0–100.0)
Monocytes Absolute: 0.6 10*3/uL (ref 0.1–1.0)
Monocytes Relative: 10 %
Neutro Abs: 3.7 10*3/uL (ref 1.7–7.7)
Neutrophils Relative %: 56 %
Platelet Count: 589 10*3/uL — ABNORMAL HIGH (ref 150–400)
RBC: 3.38 MIL/uL — ABNORMAL LOW (ref 3.87–5.11)
RDW: 21.3 % — ABNORMAL HIGH (ref 11.5–15.5)
WBC: 6.6 10*3/uL (ref 4.0–10.5)
nRBC: 0 % (ref 0.0–0.2)

## 2018-07-13 LAB — SAVE SMEAR(SSMR), FOR PROVIDER SLIDE REVIEW

## 2018-07-13 NOTE — Progress Notes (Addendum)
[facility] New Patient Consult   Requesting MD: Jac Canavan, Pa-c [address] [place], [place] [address]   Taylor Booth 43 y.o.  January 11, 1976    Reason for Consult: Anemia   HPI: Taylor Booth reports a history of anemia dating to when she began having children.  She has a monthly menstrual cycle.  No other bleeding. A CBC on 07/03/2018 found the hemoglobin at 7, MCV 75, white count 6.6, platelets 724,000, and ANC 3.6. Additional labs on the same day were remarkable for an albumin of 2.1, LDL 248, and triglycerides 159.  She follows a normal diet.  She began ferrous sulfate earlier this week.  She had an episode of nausea after taking iron.  No nausea prior to taking iron.  Past Medical History:  Diagnosis Date  . Allergy   . Anemia   . Depression 10/2017  . Hyperlipidemia 10/2017  . Hypertension 10/2017  . Wears glasses     .  G3, P3, regular monthly menses   .  Renal biopsy at age 76 or 77 for evaluation of proteinuria and edema, treated with prednisone  Past Surgical History:  Procedure Laterality Date  . CESAREAN SECTION     x3, 2003, 1997, 1991    .  Right foot bunion surgery  Medications: Reviewed  Allergies:  Allergies  Allergen Reactions  . Penicillins Nausea And Vomiting    Family history: No family history of cancer or anemia  Social History:   She lives in [place] with her husband and a child.  She works with a special needs population.  She reports social alcohol use.  She quit cigarettes in August 2019.  No transfusion history.  No risk factor for HIV or hepatitis.  ROS:   Positives include: Fatigue, dyspnea, "hot feeling "in the chest last summer, leg swelling, ice craving, rash on the hands  A complete ROS was otherwise negative.  Physical Exam:  Blood pressure (!) 155/81, pulse 84, temperature 98.3 F (36.8 C), temperature source Oral, resp. rate 18, height 5' 4.5" (1.638 m), weight 160 lb 9.6 oz (72.8 kg), last  menstrual period 06/19/2018, SpO2 100 %.  HEENT: Oropharynx without visible mass, soft symmetric fullness in the area of the thyroid Lungs: Clear bilaterally Cardiac: Regular rate and rhythm Abdomen: Nontender, no hepatosplenomegaly, no mass  Vascular: 1+ pitting edema at the lower leg bilaterally, trace pitting edema at the thigh bilaterally Lymph nodes: No cervical, supraclavicular, or inguinal nodes.  "Shotty "bilateral axillary nodes Neurologic: Alert and oriented, the motor exam appears intact in the upper and lower extremities bilaterally Skin: No rash Musculoskeletal: No spine tenderness   LAB:  CBC  Lab Results  Component Value Date   WBC 6.6 07/13/2018   HGB 7.3 (L) 07/13/2018   HCT 24.3 (L) 07/13/2018   MCV 71.9 (L) 07/13/2018   PLT 589 (H) 07/13/2018   NEUTROABS 3.7 07/13/2018  Full blood smear: Marked variation in size and shape of the red blood cells.  There are teardrops, target cells, acanthocytes, cigar cells, burr cells, and a few schistocytes.  The white cell morphology is unremarkable.  The platelets appear normal in number.  The polychromasia is increased.     CMP  Lab Results  Component Value Date   NA 137 07/03/2018   K 4.0 07/03/2018   CL 103 07/03/2018   CO2 23 07/03/2018   GLUCOSE 88 07/03/2018   BUN 8 07/03/2018   CREATININE 0.67 07/03/2018   CALCIUM 7.7 (L) 07/03/2018  PROT 4.2 (LL) 07/03/2018   PROT 4.2 (LL) 07/03/2018   ALBUMIN 2.1 (L) 07/03/2018   AST 18 07/03/2018   ALT 10 07/03/2018   ALKPHOS 89 07/03/2018   BILITOT <0.2 07/03/2018   GFRNONAA 107 07/03/2018   GFRAA 124 07/03/2018   07/03/2018: Serum protein electrophoresis- no M spike observed, serum iron 11  24-hour urine 07/07/2018: 8895 mg protein, no M spike observed     Assessment/Plan:   1. Microcytic anemia secondary to iron deficiency 2. Thrombocytosis-likely secondary to iron deficiency 3. Nephrotic syndrome 4. G3, P3 5. Hypertension 6. Hyperlipidemia- likely  related to nephrotic syndrome 7. Reported history of proteinuria and a renal biopsy as a child 8. Depression   Disposition:   Taylor Booth is referred for evaluation of anemia.  The hematologic indices and peripheral blood smear are consistent with a diagnosis of iron deficiency anemia.  The iron deficiency is likely related to menstrual blood loss, but she should undergo screening for another source of blood loss.  I recommend a repeat urinalysis and stool Hemoccult cards via her primary provider.  She should also undergo a screening colonoscopy.  She is taking ferrous sulfate.  The anemia and thrombocytosis should correct with oral iron replacement.  I am available to see her again if the anemia does not resolve with oral iron.  Taylor Booth has nephrotic syndrome.  She should see nephrology as soon as possible for evaluation and treatment.  I cannot relate the iron deficiency to nephrotic syndrome.  I will follow-up on iron studies from today.  She is not scheduled for a follow-up appointment in the hematology clinic.  I am available to see her in the future as needed.  Thornton Papas, MD  07/13/2018, 11:03 AM

## 2018-07-13 NOTE — Telephone Encounter (Signed)
F/u TBA per los

## 2018-07-15 LAB — FERRITIN: Ferritin: 39 ng/mL (ref 11–307)

## 2018-07-18 ENCOUNTER — Encounter: Payer: Self-pay | Admitting: Internal Medicine

## 2018-07-18 ENCOUNTER — Other Ambulatory Visit: Payer: No Typology Code available for payment source

## 2018-07-19 ENCOUNTER — Encounter: Payer: Self-pay | Admitting: Internal Medicine

## 2018-07-24 ENCOUNTER — Ambulatory Visit (INDEPENDENT_AMBULATORY_CARE_PROVIDER_SITE_OTHER): Payer: BLUE CROSS/BLUE SHIELD | Admitting: Medical

## 2018-07-24 ENCOUNTER — Encounter: Payer: Self-pay | Admitting: Medical

## 2018-07-24 ENCOUNTER — Other Ambulatory Visit: Payer: Self-pay

## 2018-07-24 VITALS — BP 140/84 | HR 111 | Temp 98.3°F | Resp 16 | Ht 64.0 in | Wt 157.4 lb

## 2018-07-24 DIAGNOSIS — D509 Iron deficiency anemia, unspecified: Secondary | ICD-10-CM

## 2018-07-24 DIAGNOSIS — I10 Essential (primary) hypertension: Secondary | ICD-10-CM

## 2018-07-24 DIAGNOSIS — R059 Cough, unspecified: Secondary | ICD-10-CM

## 2018-07-24 DIAGNOSIS — R05 Cough: Secondary | ICD-10-CM

## 2018-07-24 DIAGNOSIS — J988 Other specified respiratory disorders: Secondary | ICD-10-CM | POA: Diagnosis not present

## 2018-07-24 DIAGNOSIS — L309 Dermatitis, unspecified: Secondary | ICD-10-CM | POA: Insufficient documentation

## 2018-07-24 DIAGNOSIS — R3129 Other microscopic hematuria: Secondary | ICD-10-CM

## 2018-07-24 DIAGNOSIS — N049 Nephrotic syndrome with unspecified morphologic changes: Secondary | ICD-10-CM | POA: Insufficient documentation

## 2018-07-24 DIAGNOSIS — R011 Cardiac murmur, unspecified: Secondary | ICD-10-CM

## 2018-07-24 LAB — POCT URINALYSIS DIP (PROADVANTAGE DEVICE)
Bilirubin, UA: NEGATIVE
GLUCOSE UA: NEGATIVE mg/dL
Ketones, POC UA: NEGATIVE mg/dL
Leukocytes, UA: NEGATIVE
Nitrite, UA: NEGATIVE
Protein Ur, POC: >=300 mg/dL — AB
Specific Gravity, Urine: 1.03
UUROB: NEGATIVE
pH, UA: 6 (ref 5.0–8.0)

## 2018-07-24 LAB — POCT INFLUENZA A/B
Influenza A, POC: NEGATIVE
Influenza B, POC: NEGATIVE

## 2018-07-24 MED ORDER — HYDROCODONE-HOMATROPINE 5-1.5 MG/5ML PO SYRP: 5.0000 mL | ORAL_SOLUTION | Freq: Three times a day (TID) | ORAL | 0 refills | Status: AC | PRN

## 2018-07-24 MED ORDER — TRIAMCINOLONE ACETONIDE 0.1 % EX CREA: 1.0000 "application " | TOPICAL_CREAM | Freq: Two times a day (BID) | CUTANEOUS | 0 refills | Status: DC

## 2018-07-24 MED ORDER — CLARITHROMYCIN 500 MG PO TABS: 500.0000 mg | ORAL_TABLET | Freq: Two times a day (BID) | ORAL | 0 refills | Status: DC

## 2018-07-24 MED ORDER — ALBUTEROL SULFATE HFA 108 (90 BASE) MCG/ACT IN AERS: 2.0000 | INHALATION_SPRAY | Freq: Four times a day (QID) | RESPIRATORY_TRACT | 0 refills | Status: DC | PRN

## 2018-07-24 NOTE — Progress Notes (Signed)
Subjective:  Taylor Booth is a 43 y.o. female who presents for possible influenza.   Here for 7 days hx/o cough, body aches, weakness, congestion, sneezing.  Belly hurts from coughing so much.   No fever documented but has felt feverish.  Has had some chills initially.    No NVD.  coughing up phlegm.  No sick contacts.   Using fast max mucinex.    Has ongoing rash, not improving, itchy.  Using lotion.  Had this last visit, bumps on thighs, arms, torso.    Since last visit she saw hematology, compliant with iron    Has gotten appt from nephrology, had to reschedule due to current illness.  No other aggravating or relieving factors.  No other c/o.  The following portions of the patient's history were reviewed and updated as appropriate: allergies, current medications, past medical history, past social history and problem list.   Past Medical History:  Diagnosis Date   Allergy    Anemia    Depression 10/2017   Hyperlipidemia 10/2017   Hypertension 10/2017   Wears glasses    Current Outpatient Medications on File Prior to Visit  Medication Sig Dispense Refill   amLODipine (NORVASC) 10 MG tablet Take 1 tablet (10 mg total) by mouth daily. 30 tablet 6   atorvastatin (LIPITOR) 40 MG tablet Take 1 tablet (40 mg total) by mouth daily. 30 tablet 6   Iron Combinations (IRON COMPLEX PO) Take 2 tablets by mouth daily.     losartan (COZAAR) 25 MG tablet Take 1 tablet (25 mg total) by mouth daily. 30 tablet 6   Phenyleph-Doxylamine-DM-APAP (MUCINEX FAST-MAX COLD FLU NGHT) 5-6.25-10-325 MG CAPS Take 2 capsules by mouth every 4 (four) hours.     escitalopram (LEXAPRO) 10 MG tablet Take 1 tablet (10 mg total) by mouth daily. (Patient not taking: Reported on 07/24/2018) 30 tablet 2   No current facility-administered medications on file prior to visit.    ROS as in subjective    Objective: BP 140/84    Pulse (!) 111    Temp 98.3 F (36.8 C) (Oral)    Resp 16    Ht 5\' 4"  (1.626 m)     Wt 157 lb 6.4 oz (71.4 kg)    SpO2 99%    BMI 27.02 kg/m   Wt Readings from Last 3 Encounters:  07/24/18 157 lb 6.4 oz (71.4 kg)  07/13/18 160 lb 9.6 oz (72.8 kg)  07/04/18 164 lb (74.4 kg)   BP Readings from Last 3 Encounters:  07/24/18 140/84  07/13/18 (!) 155/81  07/04/18 130/88    General: Ill-appearing, well-developed, well-nourished Skin: warm, dry HEENT: Nose inflamed and congested, clear conjunctiva, TMs pearly, no sinus tenderness, pharynx with erythema, no exudates Neck: Supple, non tender, shotty cervical adenopathy Heart: Regular rate and rhythm, normal S1, S2, faint  Murmurs, unchanged from last visit Lungs: Clear to auscultation bilaterally, no wheezes, rales, rhonchi Skin: Scattered flesh-colored papules on arms thighs low back and abdomen, no erythema no induration or warmth Extremities: Mild generalized tenderness   Assessment: Encounter Diagnoses  Name Primary?   Cough Yes   Respiratory tract infection    Dermatitis    Nephrotic syndrome    Essential hypertension    Murmur    Iron deficiency anemia, unspecified iron deficiency anemia type      Plan: Hypertension-improving, compliant with current medication  Dermatitis-begin cream below for the next 7 to 10 days, and call back if not resolved  Nephrotic syndrome-advised importance  of seeing nephrology soon.  She has appointment scheduled  Murmur-likely due to underlying issues with anemia and recently uncontrolled blood pressure.  Continue to monitor, consider echocardiogram going forward  Iron deficiency anemia-reviewed recent hematology notes, compliant with iron, stool cards today, urinalysis reviewed today.  Consider colonoscopy.  Discussed diagnosis of respiratory tract infection.   Discussed supportive care including rest, hydration, OTC Tylenol or NSAID for fever, aches, and malaise.  Discussed period of contagion, self quarantine at home away from others to avoid spread of disease,  discussed means of transmission, and possible complications including pneumonia.  If worse or not improving within the next 4-5 days, then call or return.  Reviewed the recommendations with patient below   Recommendations: Respiratory tract infection  begin Biaxin antibiotic twice daily for 10 days, you can use Hycodan cough syrup as needed with caution as this can cause drowsiness  Rest, drink plenty of fluids  Begin albuterol rescue inhaler 3 times a day or 2 puffs every 4-6 hours as needed  Rash Begin triamcinolone cream twice daily for 7 to 10 days but do not keep using this.  Call if your rash is not completely gone within 10 days  Nephrotic syndrome, protein in the urine  Follow-up with kidney doctor soon as possible  Anemia  Continue iron therapy  Return to stool cards  We may need to consider colonoscopy to rule out other sources of blood loss    Patient voiced understanding of diagnosis, recommendations, and treatment plan.  After visit summary given.  Gave note for work.     [name] was seen today for cough.  Diagnoses and all orders for this visit:  Cough -     Influenza A/B  Respiratory tract infection  Dermatitis  Nephrotic syndrome  Essential hypertension  Murmur  Iron deficiency anemia, unspecified iron deficiency anemia type -     POCT Urinalysis DIP (Proadvantage Device) -     Cancel: POCT Urinalysis DIP (Proadvantage Device)  Other orders -     clarithromycin (BIAXIN) 500 MG tablet; Take 1 tablet (500 mg total) by mouth 2 (two) times daily. -     HYDROcodone-homatropine (HYCODAN) 5-1.5 MG/5ML syrup; Take 5 mLs by mouth every 8 (eight) hours as needed for up to 5 days. -     albuterol (PROVENTIL HFA;VENTOLIN HFA) 108 (90 Base) MCG/ACT inhaler; Inhale 2 puffs into the lungs every 6 (six) hours as needed for wheezing or shortness of breath. -     triamcinolone cream (KENALOG) 0.1 %; Apply 1 application topically 2 (two) times daily.

## 2018-07-24 NOTE — Addendum Note (Signed)
Addended by: Jac Canavan on: 07/24/2018 11:43 AM   Modules accepted: Orders

## 2018-07-24 NOTE — Patient Instructions (Signed)
Recommendations: Respiratory tract infection  begin Biaxin antibiotic twice daily for 10 days, you can use Hycodan cough syrup as needed with caution as this can cause drowsiness  Rest, drink plenty of fluids  Begin albuterol rescue inhaler 3 times a day or 2 puffs every 4-6 hours as needed  Rash Begin triamcinolone cream twice daily for 7 to 10 days but do not keep using this.  Call if your rash is not completely gone within 10 days  Nephrotic syndrome, protein in the urine  Follow-up with kidney doctor soon as possible  Anemia  Continue iron therapy  Return to stool cards  We may need to consider colonoscopy to rule out other sources of blood loss

## 2018-07-25 ENCOUNTER — Ambulatory Visit
Admission: RE | Admit: 2018-07-25 | Discharge: 2018-07-25 | Disposition: A | Discharge status: Home / Self Care | Payer: BLUE CROSS/BLUE SHIELD | Source: Ambulatory Visit | Attending: Medical | Admitting: Medical

## 2018-07-25 DIAGNOSIS — D649 Anemia, unspecified: Secondary | ICD-10-CM

## 2018-07-25 DIAGNOSIS — E010 Iodine-deficiency related diffuse (endemic) goiter: Secondary | ICD-10-CM | POA: Diagnosis not present

## 2018-07-25 DIAGNOSIS — E8809 Other disorders of plasma-protein metabolism, not elsewhere classified: Secondary | ICD-10-CM

## 2018-07-25 DIAGNOSIS — I10 Essential (primary) hypertension: Secondary | ICD-10-CM

## 2018-07-25 DIAGNOSIS — R609 Edema, unspecified: Secondary | ICD-10-CM

## 2018-07-25 DIAGNOSIS — E079 Disorder of thyroid, unspecified: Secondary | ICD-10-CM

## 2018-07-25 DIAGNOSIS — E042 Nontoxic multinodular goiter: Secondary | ICD-10-CM | POA: Diagnosis not present

## 2018-07-25 LAB — URINALYSIS, MICROSCOPIC ONLY

## 2018-08-02 ENCOUNTER — Other Ambulatory Visit (INDEPENDENT_AMBULATORY_CARE_PROVIDER_SITE_OTHER): Payer: BLUE CROSS/BLUE SHIELD

## 2018-08-02 ENCOUNTER — Other Ambulatory Visit: Payer: Self-pay

## 2018-08-02 DIAGNOSIS — D509 Iron deficiency anemia, unspecified: Secondary | ICD-10-CM

## 2018-08-02 LAB — HEMOCCULT GUIAC POC 1CARD (OFFICE)
Card #2 Fecal Occult Blod, POC: NEGATIVE
Card #3 Fecal Occult Blood, POC: NEGATIVE
Fecal Occult Blood, POC: NEGATIVE

## 2018-08-04 ENCOUNTER — Other Ambulatory Visit: Payer: Self-pay | Admitting: Medical

## 2018-08-04 DIAGNOSIS — D649 Anemia, unspecified: Secondary | ICD-10-CM

## 2018-08-05 ENCOUNTER — Other Ambulatory Visit: Payer: BLUE CROSS/BLUE SHIELD | Admitting: Medical

## 2018-08-05 ENCOUNTER — Other Ambulatory Visit: Payer: BLUE CROSS/BLUE SHIELD

## 2018-08-05 ENCOUNTER — Other Ambulatory Visit: Payer: Self-pay

## 2018-08-05 DIAGNOSIS — Z1211 Encounter for screening for malignant neoplasm of colon: Secondary | ICD-10-CM

## 2018-08-05 DIAGNOSIS — D649 Anemia, unspecified: Secondary | ICD-10-CM

## 2018-08-09 DIAGNOSIS — R809 Proteinuria, unspecified: Secondary | ICD-10-CM | POA: Diagnosis not present

## 2018-08-09 DIAGNOSIS — I129 Hypertensive chronic kidney disease with stage 1 through stage 4 chronic kidney disease, or unspecified chronic kidney disease: Secondary | ICD-10-CM | POA: Diagnosis not present

## 2018-08-09 DIAGNOSIS — E785 Hyperlipidemia, unspecified: Secondary | ICD-10-CM | POA: Diagnosis not present

## 2018-08-09 DIAGNOSIS — N049 Nephrotic syndrome with unspecified morphologic changes: Secondary | ICD-10-CM | POA: Diagnosis not present

## 2018-08-10 DIAGNOSIS — N049 Nephrotic syndrome with unspecified morphologic changes: Secondary | ICD-10-CM | POA: Diagnosis not present

## 2018-08-21 ENCOUNTER — Other Ambulatory Visit: Payer: BLUE CROSS/BLUE SHIELD

## 2018-08-21 ENCOUNTER — Other Ambulatory Visit: Payer: Self-pay

## 2018-08-21 DIAGNOSIS — D649 Anemia, unspecified: Secondary | ICD-10-CM | POA: Diagnosis not present

## 2018-08-21 LAB — CBC
Hematocrit: 27 % — ABNORMAL LOW (ref 34.0–46.6)
Hemoglobin: 8.2 g/dL — ABNORMAL LOW (ref 11.1–15.9)
MCH: 24.8 pg — ABNORMAL LOW (ref 26.6–33.0)
MCHC: 30.4 g/dL — ABNORMAL LOW (ref 31.5–35.7)
MCV: 82 fL (ref 79–97)
Platelets: 455 10*3/uL — ABNORMAL HIGH (ref 150–450)
RBC: 3.31 x10E6/uL — ABNORMAL LOW (ref 3.77–5.28)
RDW: 23 % — ABNORMAL HIGH (ref 11.7–15.4)
WBC: 11.7 10*3/uL — ABNORMAL HIGH (ref 3.4–10.8)

## 2018-08-22 ENCOUNTER — Other Ambulatory Visit: Payer: BLUE CROSS/BLUE SHIELD

## 2018-08-27 ENCOUNTER — Telehealth: Payer: Self-pay

## 2018-08-27 NOTE — Telephone Encounter (Signed)
Patient called wanting refill on Lipitor sent to [facility].

## 2018-08-28 ENCOUNTER — Other Ambulatory Visit: Payer: Self-pay | Admitting: Medical

## 2018-08-28 DIAGNOSIS — E7841 Elevated Lipoprotein(a): Secondary | ICD-10-CM

## 2018-08-28 DIAGNOSIS — I10 Essential (primary) hypertension: Secondary | ICD-10-CM

## 2018-08-28 MED ORDER — ATORVASTATIN CALCIUM 40 MG PO TABS: 40.0000 mg | ORAL_TABLET | Freq: Every day | ORAL | 0 refills | Status: DC

## 2018-08-28 MED ORDER — FERROUS GLUCONATE 324 (38 FE) MG PO TABS: 324.0000 mg | ORAL_TABLET | Freq: Three times a day (TID) | ORAL | 2 refills | Status: DC

## 2018-08-28 MED ORDER — AMLODIPINE BESYLATE 10 MG PO TABS: 10.0000 mg | ORAL_TABLET | Freq: Every day | ORAL | 0 refills | Status: DC

## 2018-08-28 MED ORDER — LOSARTAN POTASSIUM 25 MG PO TABS: 25.0000 mg | ORAL_TABLET | Freq: Every day | ORAL | 0 refills | Status: DC

## 2018-08-28 NOTE — Telephone Encounter (Signed)
I sent medications to pharmacy for 90 day supply.  I changed the iron to [facility] iron 1 tablet, three times daily.   Take this with food or orange juice.   Her hemoglobin is now above 8 which is going in the right direction.  How is she feeling?  When does she see the kidney doctor again?  I would like to do a follow up with her in 6-8 weeks to stay on top of the anemia.   At some point she will need to see gastroenterology as well.

## 2018-08-28 NOTE — Telephone Encounter (Signed)
Left message on voicemail for patient to call back. 

## 2018-08-30 ENCOUNTER — Telehealth: Payer: Self-pay | Admitting: Internal Medicine

## 2018-08-30 NOTE — Telephone Encounter (Signed)
Left message on voicemail for patient to call back. 

## 2018-08-30 NOTE — Telephone Encounter (Signed)
Pt called about her iron med. It is to be taken 3 times a day if she can tolerate it. If not then she can take 2 a day but her hemoglobin is low so she needs to take it 3 times a day.   Pt is also on prednisone by GI doctor and having trouble sleeping. Per shane. It is ok for pt to take melatontion 5mg  over the counter

## 2018-08-30 NOTE — Telephone Encounter (Signed)
Patient notified of lab results and recommendations. 

## 2018-09-04 DIAGNOSIS — N049 Nephrotic syndrome with unspecified morphologic changes: Secondary | ICD-10-CM | POA: Diagnosis not present

## 2018-09-04 DIAGNOSIS — E785 Hyperlipidemia, unspecified: Secondary | ICD-10-CM | POA: Diagnosis not present

## 2018-09-06 DIAGNOSIS — R809 Proteinuria, unspecified: Secondary | ICD-10-CM | POA: Diagnosis not present

## 2018-09-06 DIAGNOSIS — E785 Hyperlipidemia, unspecified: Secondary | ICD-10-CM | POA: Diagnosis not present

## 2018-09-06 DIAGNOSIS — N049 Nephrotic syndrome with unspecified morphologic changes: Secondary | ICD-10-CM | POA: Diagnosis not present

## 2018-10-02 ENCOUNTER — Encounter: Payer: Self-pay | Admitting: Medical

## 2018-10-08 DIAGNOSIS — N049 Nephrotic syndrome with unspecified morphologic changes: Secondary | ICD-10-CM | POA: Diagnosis not present

## 2018-10-08 DIAGNOSIS — E785 Hyperlipidemia, unspecified: Secondary | ICD-10-CM | POA: Diagnosis not present

## 2018-10-08 DIAGNOSIS — R809 Proteinuria, unspecified: Secondary | ICD-10-CM | POA: Diagnosis not present

## 2018-10-09 DIAGNOSIS — N390 Urinary tract infection, site not specified: Secondary | ICD-10-CM | POA: Diagnosis not present

## 2018-10-14 DIAGNOSIS — N049 Nephrotic syndrome with unspecified morphologic changes: Secondary | ICD-10-CM | POA: Diagnosis not present

## 2018-10-14 DIAGNOSIS — E785 Hyperlipidemia, unspecified: Secondary | ICD-10-CM | POA: Diagnosis not present

## 2018-10-14 DIAGNOSIS — R809 Proteinuria, unspecified: Secondary | ICD-10-CM | POA: Diagnosis not present

## 2018-10-22 ENCOUNTER — Other Ambulatory Visit (HOSPITAL_COMMUNITY): Payer: Self-pay | Admitting: Nephrology

## 2018-10-22 DIAGNOSIS — R809 Proteinuria, unspecified: Secondary | ICD-10-CM

## 2018-10-22 DIAGNOSIS — N059 Unspecified nephritic syndrome with unspecified morphologic changes: Secondary | ICD-10-CM

## 2018-10-28 ENCOUNTER — Telehealth (HOSPITAL_COMMUNITY): Payer: Self-pay

## 2018-11-01 ENCOUNTER — Telehealth: Payer: Self-pay | Admitting: Medical

## 2018-11-01 ENCOUNTER — Encounter: Payer: Self-pay | Admitting: Internal Medicine

## 2018-11-01 ENCOUNTER — Other Ambulatory Visit: Payer: Self-pay | Admitting: Internal Medicine

## 2018-11-01 DIAGNOSIS — Z20828 Contact with and (suspected) exposure to other viral communicable diseases: Secondary | ICD-10-CM | POA: Diagnosis not present

## 2018-11-01 NOTE — Telephone Encounter (Signed)
Please call and see how we can help her.

## 2018-11-01 NOTE — Telephone Encounter (Signed)
Pt was notified that she needs to self quartinine until she get results back. She got tested at [facility] and was told 2-4 days until results. I will write pt out of work until results

## 2018-11-01 NOTE — Telephone Encounter (Signed)
Pt is now on mychart. She has been sent a letter through [place] and will also send it by mail

## 2018-11-01 NOTE — Telephone Encounter (Signed)
  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 She was tested today, she has not been taken out of work. She has concerns and would like to talk to someone

## 2018-11-07 ENCOUNTER — Ambulatory Visit (HOSPITAL_COMMUNITY): Payer: BLUE CROSS/BLUE SHIELD

## 2018-11-11 ENCOUNTER — Other Ambulatory Visit: Payer: Self-pay | Admitting: Radiology

## 2018-11-11 ENCOUNTER — Other Ambulatory Visit: Payer: Self-pay | Admitting: Student

## 2018-11-12 ENCOUNTER — Encounter (HOSPITAL_COMMUNITY): Payer: Self-pay

## 2018-11-12 ENCOUNTER — Other Ambulatory Visit: Payer: Self-pay

## 2018-11-12 ENCOUNTER — Ambulatory Visit (HOSPITAL_COMMUNITY)
Admission: RE | Admit: 2018-11-12 | Discharge: 2018-11-12 | Disposition: A | Discharge status: Home / Self Care | Payer: BC Managed Care – PPO | Source: Ambulatory Visit | Attending: Nephrology | Admitting: Nephrology

## 2018-11-12 DIAGNOSIS — Z87891 Personal history of nicotine dependence: Secondary | ICD-10-CM | POA: Insufficient documentation

## 2018-11-12 DIAGNOSIS — Z79899 Other long term (current) drug therapy: Secondary | ICD-10-CM | POA: Diagnosis not present

## 2018-11-12 DIAGNOSIS — N052 Unspecified nephritic syndrome with diffuse membranous glomerulonephritis: Secondary | ICD-10-CM | POA: Insufficient documentation

## 2018-11-12 DIAGNOSIS — E785 Hyperlipidemia, unspecified: Secondary | ICD-10-CM | POA: Diagnosis not present

## 2018-11-12 DIAGNOSIS — R809 Proteinuria, unspecified: Secondary | ICD-10-CM | POA: Diagnosis not present

## 2018-11-12 DIAGNOSIS — D649 Anemia, unspecified: Secondary | ICD-10-CM | POA: Insufficient documentation

## 2018-11-12 DIAGNOSIS — N049 Nephrotic syndrome with unspecified morphologic changes: Secondary | ICD-10-CM | POA: Diagnosis not present

## 2018-11-12 DIAGNOSIS — I10 Essential (primary) hypertension: Secondary | ICD-10-CM | POA: Diagnosis not present

## 2018-11-12 DIAGNOSIS — N269 Renal sclerosis, unspecified: Secondary | ICD-10-CM | POA: Diagnosis not present

## 2018-11-12 DIAGNOSIS — Z7901 Long term (current) use of anticoagulants: Secondary | ICD-10-CM | POA: Diagnosis not present

## 2018-11-12 DIAGNOSIS — N059 Unspecified nephritic syndrome with unspecified morphologic changes: Secondary | ICD-10-CM

## 2018-11-12 LAB — CBC
HCT: 32.8 % — ABNORMAL LOW (ref 36.0–46.0)
Hemoglobin: 10.5 g/dL — ABNORMAL LOW (ref 12.0–15.0)
MCH: 28.5 pg (ref 26.0–34.0)
MCHC: 32 g/dL (ref 30.0–36.0)
MCV: 88.9 fL (ref 80.0–100.0)
Platelets: 382 10*3/uL (ref 150–400)
RBC: 3.69 MIL/uL — ABNORMAL LOW (ref 3.87–5.11)
RDW: 17.2 % — ABNORMAL HIGH (ref 11.5–15.5)
WBC: 18.1 10*3/uL — ABNORMAL HIGH (ref 4.0–10.5)
nRBC: 0 % (ref 0.0–0.2)

## 2018-11-12 LAB — BASIC METABOLIC PANEL
Anion gap: 8 (ref 5–15)
BUN: 15 mg/dL (ref 6–20)
CO2: 25 mmol/L (ref 22–32)
Calcium: 8.6 mg/dL — ABNORMAL LOW (ref 8.9–10.3)
Chloride: 108 mmol/L (ref 98–111)
Creatinine, Ser: 0.9 mg/dL (ref 0.44–1.00)
GFR calc Af Amer: >60 mL/min (ref >60)
GFR calc non Af Amer: >60 mL/min (ref >60)
Glucose, Bld: 99 mg/dL (ref 70–99)
Potassium: 3.4 mmol/L — ABNORMAL LOW (ref 3.5–5.1)
Sodium: 141 mmol/L (ref 135–145)

## 2018-11-12 LAB — PROTIME-INR
INR: 0.9 (ref 0.8–1.2)
Prothrombin Time: 12.5 seconds (ref 11.4–15.2)

## 2018-11-12 MED ORDER — MIDAZOLAM HCL 2 MG/2ML IJ SOLN
INTRAMUSCULAR | Status: AC | PRN
  Administered 2018-11-12: 1 mg via INTRAVENOUS
  Administered 2018-11-12: 0.5 mg via INTRAVENOUS

## 2018-11-12 MED ORDER — FENTANYL CITRATE (PF) 100 MCG/2ML IJ SOLN
INTRAMUSCULAR | Status: AC
  Filled 2018-11-12: qty 2

## 2018-11-12 MED ORDER — GELATIN ABSORBABLE 12-7 MM EX MISC
CUTANEOUS | Status: AC
  Filled 2018-11-12: qty 1

## 2018-11-12 MED ORDER — SODIUM CHLORIDE 0.9 % IV SOLN: INTRAVENOUS | Status: DC

## 2018-11-12 MED ORDER — HYDRALAZINE HCL 20 MG/ML IJ SOLN
INTRAMUSCULAR | Status: AC
  Filled 2018-11-12: qty 1

## 2018-11-12 MED ORDER — FENTANYL CITRATE (PF) 100 MCG/2ML IJ SOLN
INTRAMUSCULAR | Status: AC | PRN
  Administered 2018-11-12: 50 ug via INTRAVENOUS
  Administered 2018-11-12: 25 ug via INTRAVENOUS

## 2018-11-12 MED ORDER — LIDOCAINE HCL (PF) 1 % IJ SOLN
INTRAMUSCULAR | Status: AC
  Filled 2018-11-12: qty 30

## 2018-11-12 MED ORDER — MIDAZOLAM HCL 2 MG/2ML IJ SOLN
INTRAMUSCULAR | Status: AC
  Filled 2018-11-12: qty 2

## 2018-11-12 MED ORDER — HYDRALAZINE HCL 20 MG/ML IJ SOLN
INTRAMUSCULAR | Status: AC | PRN
  Administered 2018-11-12 (×2): 10 mg via INTRAVENOUS

## 2018-11-12 NOTE — Sedation Documentation (Signed)
Called to give report. Nurse unavailable. Will call back 

## 2018-11-12 NOTE — Procedures (Signed)
Interventional Radiology Procedure Note  Procedure: US guided medical renal biopsy left kidney  Complications: None Recommendations:  - Ok to shower tomorrow - follow up pathology - Routine care  - advance diet - 2 hour dc home  Signed,  Dulcy Fanny. Earleen Newport, DO

## 2018-11-12 NOTE — Discharge Instructions (Addendum)
Taylor DAWKINS_________________________________________________ was treated at our facility. Injury or illness was: ___Work-related. _X__Not work-related. ___Undetermined if work-related. Return to work  Employee may return to work on __7/2/20____________________.  Employee may return to modified work on ______________________. Work activity restrictions This person is not able to do the following activities: ___Bend ___Sit for a prolonged time  This person should not sit for more than ____ hours at a time.  This person should not sit for more than ____ hours during an 8-hour workday. ___Lift more than ________ lb ___Squat ___ Stand for a prolonged time  ___ This person should not stand for more than ____ hours at a time.  ___ This person should not stand for more than ____ hours during an 8-hour workday. ___Climb ___Reach _X__Push and pull with the ___ right hand ___ left hand ___ Walk  ___ This person should not walk for more than ____ hours at a time.  ___ This person should not walk for more than ____ hours during an 8-hour workday. ___ Drive or operate a motor vehicle at work ___ [employer] with the ___ right hand ___ left hand ___Other ____MUST REST FOR 2 DAYS_____________________________________________________________ These restrictions are effective until ______________________ or until a recheck appointment on ______________________. Health care provider name (printed): _________________________________________ Health care provider (signature): _________________________________________ Date: ___6/30/20______________________________________ How to use this form Show this Return to Work statement to your supervisor at work as soon as possible. Your employer should be aware of your condition and may be able to help with the necessary work activity restrictions. Contact your health care provider if:  You wish to return to work sooner than the date that is listed  above.  You have problems that make it difficult for you to return at that time. This information is not intended to replace advice given to you by your health care provider. Make sure you discuss any questions you have with your health care provider. Document Released: 05/01/2005 Document Revised: 04/26/2017 Document Reviewed: 04/26/2017 Elsevier Patient Education  [place]. Percutaneous Kidney Biopsy, Care After This sheet gives you information about how to care for yourself after your procedure. Your health care provider may also give you more specific instructions. If you have problems or questions, contact your health care provider. What can I expect after the procedure? After the procedure, it is common to have:  Pain or soreness near the area where the needle went through your skin (biopsy site).  Bright pink or cloudy urine for 24 hours after the procedure. Follow these instructions at home: Activity  Return to your normal activities as told by your health care provider. Ask your health care provider what activities are safe for you.  Do not drive for 24 hours if you were given a medicine to help you relax (sedative).  Do not lift anything that is heavier than 10 lb (4.5 kg) until your health care provider tells you that it is safe.  Avoid activities that take a lot of effort (are strenuous) until your health care provider approves. Most people will have to wait 2 weeks before returning to activities such as exercise or sexual intercourse. General instructions   Take over-the-counter and prescription medicines only as told by your health care provider.  You may eat and drink after your procedure. Follow instructions from your health care provider about eating or drinking restrictions.  Check your biopsy site every day for signs of infection. Check for: ? More redness, swelling, or pain. ? More fluid or blood. ?  Warmth. ? Pus or a bad smell.  Keep all follow-up  visits as told by your health care provider. This is important. Contact a health care provider if:  You have more redness, swelling, or pain around your biopsy site.  You have more fluid or blood coming from your biopsy site.  Your biopsy site feels warm to the touch.  You have pus or a bad smell coming from your biopsy site.  You have blood in your urine more than 24 hours after your procedure. Get help right away if:  You have dark red or brown urine.  You have a fever.  You are unable to urinate.  You feel burning when you urinate.  You feel faint.  You have severe pain in your abdomen or side. This information is not intended to replace advice given to you by your health care provider. Make sure you discuss any questions you have with your health care provider. Document Released: 01/01/2013 Document Revised: 04/13/2017 Document Reviewed: 02/11/2016 Elsevier Patient Education  2020 [employer] Inc. Moderate Conscious Sedation, Adult, Care After These instructions provide you with information about caring for yourself after your procedure. Your health care provider may also give you more specific instructions. Your treatment has been planned according to current medical practices, but problems sometimes occur. Call your health care provider if you have any problems or questions after your procedure. What can I expect after the procedure? After your procedure, it is common:  To feel sleepy for several hours.  To feel clumsy and have poor balance for several hours.  To have poor judgment for several hours.  To vomit if you eat too soon. Follow these instructions at home: For at least 24 hours after the procedure:   Do not: ? Participate in activities where you could fall or become injured. ? Drive. ? Use heavy machinery. ? Drink alcohol. ? Take sleeping pills or medicines that cause drowsiness. ? Make important decisions or sign legal documents. ? Take care of  children on your own.  Rest. Eating and drinking  Follow the diet recommended by your health care provider.  If you vomit: ? Drink water, juice, or soup when you can drink without vomiting. ? Make sure you have little or no nausea before eating solid foods. General instructions  Have a responsible adult stay with you until you are awake and alert.  Take over-the-counter and prescription medicines only as told by your health care provider.  If you smoke, do not smoke without supervision.  Keep all follow-up visits as told by your health care provider. This is important. Contact a health care provider if:  You keep feeling nauseous or you keep vomiting.  You feel light-headed.  You develop a rash.  You have a fever. Get help right away if:  You have trouble breathing. This information is not intended to replace advice given to you by your health care provider. Make sure you discuss any questions you have with your health care provider. Document Released: 02/19/2013 Document Revised: 04/13/2017 Document Reviewed: 08/21/2015 Elsevier Patient Education  2020 [employer]

## 2018-11-12 NOTE — H&P (Signed)
Chief Complaint: Patient was seen in consultation today for random renal biopsy at the request of Sanford,Ryan B  Referring Physician(s): [place] B  Supervising Physician: Corrie Mckusick  Patient Status: [facility] - Out-pt  History of Present Illness: Taylor Booth is a 43 y.o. female   Proteinuria  Nephrotic range No hematuria Nephrotic syndrome Scheduled for random renal bx per Dr Joelyn Oms  Past Medical History:  Diagnosis Date  . Allergy   . Anemia   . Depression 10/2017  . Hyperlipidemia 10/2017  . Hypertension 10/2017  . Wears glasses     Past Surgical History:  Procedure Laterality Date  . CESAREAN SECTION     x3, 2003, 1997, 1991    Allergies: Penicillins  Medications: Prior to Admission medications   Medication Sig Start Date End Date Taking? Authorizing Provider  atorvastatin (LIPITOR) 40 MG tablet Take 1 tablet (40 mg total) by mouth daily. 08/28/18  Yes Tysinger, Camelia Eng, PA-C  ferrous gluconate (FERGON) 324 MG tablet Take 1 tablet (324 mg total) by mouth 3 (three) times daily with meals. Patient taking differently: Take 324 mg by mouth 2 (two) times daily with a meal.  08/28/18  Yes Tysinger, Camelia Eng, PA-C  losartan (COZAAR) 100 MG tablet Take 100 mg by mouth at bedtime.   Yes [provider]  Multiple Vitamins-Minerals (AIRBORNE) CHEW Chew 2 each by mouth daily.   Yes [provider]  Multiple Vitamins-Minerals (MULTIVITAMIN WITH MINERALS) tablet Take 1 tablet by mouth daily.   Yes [provider]  predniSONE (DELTASONE) 20 MG tablet Take 60 mg by mouth daily with breakfast.   Yes [provider]  torsemide (DEMADEX) 20 MG tablet Take 20 mg by mouth daily.   Yes [provider]     Family History  Problem Relation Age of Onset  . Diabetes Mother   . Cancer Father        liver  . Diabetes Maternal Grandmother     Social History   Socioeconomic History  . Marital status: Single    Spouse name:  Not on file  . Number of children: Not on file  . Years of education: Not on file  . Highest education level: Not on file  Occupational History  . Not on file  Social Needs  . Financial resource strain: Not on file  . Food insecurity    Worry: Not on file    Inability: Not on file  . Transportation needs    Medical: Not on file    Non-medical: Not on file  Tobacco Use  . Smoking status: Former Smoker    Packs/day: 0.50    Years: 10.00    Pack years: 5.00    Types: Cigarettes    Quit date: 07/03/2017    Years since quitting: 1.3  . Smokeless tobacco: Never Used  . Tobacco comment: quit 48 days ago  Substance and Sexual Activity  . Alcohol use: Yes    Alcohol/week: 2.0 standard drinks    Types: 2 Glasses of wine per week  . Drug use: Never  . Sexual activity: Yes  Lifestyle  . Physical activity    Days per week: Not on file    Minutes per session: Not on file  . Stress: Not on file  Relationships  . Social Herbalist on phone: Not on file    Gets together: Not on file    Attends religious service: Not on file    Active member of club  or organization: Not on file    Attends meetings of clubs or organizations: Not on file    Relationship status: Not on file  Other Topics Concern  . Not on file  Social History Narrative   Has boyfriend.  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 06/2018    Review of Systems: A 12 point ROS discussed and pertinent positives are indicated in the HPI above.  All other systems are negative.  Review of Systems  Constitutional: Negative for activity change, fatigue and fever.  Respiratory: Negative for cough and shortness of breath.   Gastrointestinal: Negative for abdominal pain.  Musculoskeletal: Negative for back pain.  Neurological: Negative for weakness.  Psychiatric/Behavioral: Negative for behavioral problems and  confusion.    Vital Signs: LMP 11/04/2018   Physical Exam Vitals signs reviewed.  Cardiovascular:     Rate and Rhythm: Normal rate and regular rhythm.     Heart sounds: Normal heart sounds.  Pulmonary:     Effort: Pulmonary effort is normal.     Breath sounds: Normal breath sounds.  Abdominal:     Tenderness: There is no abdominal tenderness.  Musculoskeletal: Normal range of motion.     Right lower leg: No edema.     Left lower leg: No edema.  Skin:    General: Skin is warm and dry.  Neurological:     Mental Status: She is alert and oriented to person, place, and time.  Psychiatric:        Mood and Affect: Mood normal.        Behavior: Behavior normal.        Thought Content: Thought content normal.        Judgment: Judgment normal.     Imaging: No results found.  Labs:  CBC: Recent Labs    07/03/18 1150 07/13/18 0913 08/21/18 0845  WBC 6.6 6.6 11.7*  HGB 7.0* 7.3* 8.2*  HCT 23.5* 24.3* 27.0*  PLT 724* 589* 455*    COAGS: No results for input(s): INR, APTT in the last 8760 hours.  BMP: Recent Labs    07/03/18 1150  NA 137  K 4.0  CL 103  CO2 23  GLUCOSE 88  BUN 8  CALCIUM 7.7*  CREATININE 0.67  GFRNONAA 107  GFRAA 124    LIVER FUNCTION TESTS: Recent Labs    07/03/18 1150  BILITOT <0.2  AST 18  ALT 10  ALKPHOS 89  PROT 4.2*  4.2*  ALBUMIN 2.1*    TUMOR MARKERS: No results for input(s): AFPTM, CEA, CA199, CHROMGRNA in the last 8760 hours.  Assessment and Plan:  Nephrotic syndrome Nephrotic range proteinuria Scheduled for random renal biopsy Risks and benefits of random renal bx was discussed with the patient and/or patient's family including, but not limited to bleeding, infection, damage to adjacent structures or low yield requiring additional tests.  All of the questions were answered and there is agreement to proceed. Consent signed and in chart.  Thank you for this interesting consult.  I greatly enjoyed meeting Taylor Regalandra  Booth and look forward to participating in their care.  A copy of this report was sent to the requesting provider on this date.  Electronically Signed: Robet LeuPamela A Luiz Trumpower, PA-C 11/12/2018, 6:38 AM   I spent a total of  30 Minutes   in face to face in clinical consultation, greater than 50% of which was counseling/coordinating care for random renal biopsy

## 2018-11-29 ENCOUNTER — Other Ambulatory Visit: Payer: Self-pay | Admitting: Medical

## 2018-11-29 DIAGNOSIS — E7841 Elevated Lipoprotein(a): Secondary | ICD-10-CM

## 2018-12-11 DIAGNOSIS — E785 Hyperlipidemia, unspecified: Secondary | ICD-10-CM | POA: Diagnosis not present

## 2018-12-11 DIAGNOSIS — N049 Nephrotic syndrome with unspecified morphologic changes: Secondary | ICD-10-CM | POA: Diagnosis not present

## 2018-12-12 DIAGNOSIS — E785 Hyperlipidemia, unspecified: Secondary | ICD-10-CM | POA: Diagnosis not present

## 2018-12-12 DIAGNOSIS — N049 Nephrotic syndrome with unspecified morphologic changes: Secondary | ICD-10-CM | POA: Diagnosis not present

## 2018-12-12 DIAGNOSIS — R809 Proteinuria, unspecified: Secondary | ICD-10-CM | POA: Diagnosis not present

## 2018-12-12 DIAGNOSIS — N022 Recurrent and persistent hematuria with diffuse membranous glomerulonephritis: Secondary | ICD-10-CM | POA: Diagnosis not present

## 2018-12-18 DIAGNOSIS — E782 Mixed hyperlipidemia: Secondary | ICD-10-CM | POA: Diagnosis not present

## 2018-12-18 DIAGNOSIS — S8991XA Unspecified injury of right lower leg, initial encounter: Secondary | ICD-10-CM | POA: Diagnosis not present

## 2018-12-18 DIAGNOSIS — I10 Essential (primary) hypertension: Secondary | ICD-10-CM | POA: Diagnosis not present

## 2018-12-18 DIAGNOSIS — M25561 Pain in right knee: Secondary | ICD-10-CM | POA: Diagnosis not present

## 2018-12-18 DIAGNOSIS — Z880 Allergy status to penicillin: Secondary | ICD-10-CM | POA: Diagnosis not present

## 2018-12-18 DIAGNOSIS — Z79899 Other long term (current) drug therapy: Secondary | ICD-10-CM | POA: Diagnosis not present

## 2019-01-06 ENCOUNTER — Encounter: Payer: Self-pay | Admitting: Medical

## 2019-07-04 ENCOUNTER — Other Ambulatory Visit: Payer: Self-pay | Admitting: Medical

## 2019-07-04 DIAGNOSIS — E7841 Elevated Lipoprotein(a): Secondary | ICD-10-CM

## 2019-07-07 ENCOUNTER — Encounter: Payer: Self-pay | Admitting: Medical

## 2019-07-07 ENCOUNTER — Other Ambulatory Visit: Payer: Self-pay

## 2019-07-07 ENCOUNTER — Ambulatory Visit: Payer: Medicaid Other | Admitting: Medical

## 2019-07-07 VITALS — BP 140/88 | HR 82 | Temp 98.6°F | Ht 64.0 in | Wt 141.8 lb

## 2019-07-07 DIAGNOSIS — Z124 Encounter for screening for malignant neoplasm of cervix: Secondary | ICD-10-CM

## 2019-07-07 DIAGNOSIS — N049 Nephrotic syndrome with unspecified morphologic changes: Secondary | ICD-10-CM

## 2019-07-07 DIAGNOSIS — R0602 Shortness of breath: Secondary | ICD-10-CM

## 2019-07-07 DIAGNOSIS — F320 Major depressive disorder, single episode, mild: Secondary | ICD-10-CM

## 2019-07-07 DIAGNOSIS — E785 Hyperlipidemia, unspecified: Secondary | ICD-10-CM

## 2019-07-07 DIAGNOSIS — Z7189 Other specified counseling: Secondary | ICD-10-CM

## 2019-07-07 DIAGNOSIS — F17200 Nicotine dependence, unspecified, uncomplicated: Secondary | ICD-10-CM | POA: Insufficient documentation

## 2019-07-07 DIAGNOSIS — E611 Iron deficiency: Secondary | ICD-10-CM

## 2019-07-07 DIAGNOSIS — E041 Nontoxic single thyroid nodule: Secondary | ICD-10-CM

## 2019-07-07 DIAGNOSIS — Z1231 Encounter for screening mammogram for malignant neoplasm of breast: Secondary | ICD-10-CM

## 2019-07-07 DIAGNOSIS — Z0000 Encounter for general adult medical examination without abnormal findings: Secondary | ICD-10-CM | POA: Diagnosis not present

## 2019-07-07 DIAGNOSIS — Z7185 Encounter for immunization safety counseling: Secondary | ICD-10-CM

## 2019-07-07 DIAGNOSIS — I10 Essential (primary) hypertension: Secondary | ICD-10-CM

## 2019-07-07 NOTE — Progress Notes (Signed)
Subjective: Chief Complaint  Patient presents with  . Annual Exam    with fasting labs    Medical team: Dr. Thornton Papas, hematology Dr. Sabra Heck, nephrology Taylor Booth, Taylor Balo, PA-C here for primary care   Here today for a physical.  Last visit over 1 year ago.  Of note she was due for follow-up on multiple issues after her last visit.  She notes she lost her job due to [employer] and was without insurance for a while.  She just recently got approved for Medicaid  Although she is here for preventive care visit, she has several concerns  She notes feeling down in mood, depressed, just recently established with another provider before realizing she could in fact be seen here.  She only saw this provider once and was put on lamotrigine for depression.  She notes that she has tried other medicines in the past such as Zoloft did not work.  She does not want a medicine that is going to hamper her appetite or sex drive.  Her last treatment for depression was numerous years ago.  No prior hospitalization for mental health issues.  No suicidal ideation or homicidal ideation.  She has been down in mood for a while would like something to help.  She has only been on the lamotrigine 1 week.  She notes that the other doctor was going to put her on something to help with tobacco cessation when she returned.  She does not plan on going back there since she is returning here today.  This was a another primary care office not a psychiatry office.  She has never seen a psychiatrist.  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   Declines flu vaccine.  She has never had a flu shot.  She notes that she gets intermittent shortness of breath.   cant get full breath at times.  She talks about this today as if this is a follow-up from a recent visit although I have not seen her over a year ago.  She notes that she thinks the shortness of breath started sometime last year after she  was started on blood pressure and cholesterol medicine.  She was also found to be severely anemic last year.  She denies history of asthma.  She does continue to smoke.  Just started back smoking again.  She has been smoking in total for about 11 years.  She denies chest pain, syncope, dizziness.  No cough no hemoptysis no fever no night sweats.  She declines breast and pelvic exam today.  She has never had a colonoscopy.  Past Medical History:  Diagnosis Date  . Allergy   . Anemia 2020   iron deficiency, eval Dr. Thornton Papas   . Anxiety   . Chronic kidney disease 2020   nephrotic syndrome, hypoalbuminemia .  Dr. Sabra Heck, [facility]  . Depression 10/2017  . Hyperlipidemia 10/2017  . Hypertension 10/2017  . Thyroid nodule    2020, recommended biopsy, but declined at the time  . Wears glasses     Past Surgical History:  Procedure Laterality Date  . BUNIONECTOMY     right  . CESAREAN SECTION     x3, 2003, 1997, 1991  . RENAL BIOPSY  2020    Social History   Socioeconomic History  . Marital status: Single    Spouse name: Not on file  . Number of children: Not on file  . Years of  education: Not on file  . Highest education level: Not on file  Occupational History  . Not on file  Tobacco Use  . Smoking status: Current Every Day Smoker    Packs/day: 0.50    Years: 11.00    Pack years: 5.50    Types: Cigarettes    Last attempt to quit: 07/03/2017    Years since quitting: 2.0  . Smokeless tobacco: Never Used  . Tobacco comment: quit 48 days ago  Substance and Sexual Activity  . Alcohol use: Yes    Alcohol/week: 2.0 standard drinks    Types: 2 Glasses of wine per week  . Drug use: Never  . Sexual activity: Yes  Other Topics Concern  . Not on file  Social History Narrative   Lives with daughter, currently unemployed, looking for work.  Exercise - walk. 06/2019.   Social Determinants of Health   Financial Resource Strain:   . Difficulty of Paying Living  Expenses: Not on file  Food Insecurity:   . Worried About Programme researcher, broadcasting/film/video in the Last Year: Not on file  . Ran Out of Food in the Last Year: Not on file  Transportation Needs:   . Lack of Transportation (Medical): Not on file  . Lack of Transportation (Non-Medical): Not on file  Physical Activity:   . Days of Exercise per Week: Not on file  . Minutes of Exercise per Session: Not on file  Stress:   . Feeling of Stress : Not on file  Social Connections:   . Frequency of Communication with Friends and Family: Not on file  . Frequency of Social Gatherings with Friends and Family: Not on file  . Attends Religious Services: Not on file  . Active Member of Clubs or Organizations: Not on file  . Attends Banker Meetings: Not on file  . Marital Status: Not on file  Intimate Partner Violence:   . Fear of Current or Ex-Partner: Not on file  . Emotionally Abused: Not on file  . Physically Abused: Not on file  . Sexually Abused: Not on file    Family History  Problem Relation Age of Onset  . Diabetes Mother   . Cancer Father        liver  . Diabetes Maternal Grandmother      Current Outpatient Medications:  .  atorvastatin (LIPITOR) 40 MG tablet, TAKE 1 TABLET(40 MG) BY MOUTH DAILY, Disp: 90 tablet, Rfl: 0 .  ferrous gluconate (FERGON) 324 MG tablet, Take 1 tablet (324 mg total) by mouth 3 (three) times daily with meals. (Patient taking differently: Take 324 mg by mouth 2 (two) times daily with a meal. ), Disp: 90 tablet, Rfl: 2 .  lamoTRIgine (LAMICTAL) 25 MG tablet, Take 25 mg by mouth daily. Take 1 tablet daily for 2 weeks, then increase to 1 twice daily., Disp: , Rfl:  .  losartan (COZAAR) 100 MG tablet, Take 100 mg by mouth at bedtime., Disp: , Rfl:  .  Multiple Vitamins-Minerals (AIRBORNE) CHEW, Chew 2 each by mouth daily., Disp: , Rfl:  .  Multiple Vitamins-Minerals (MULTIVITAMIN WITH MINERALS) tablet, Take 1 tablet by mouth daily., Disp: , Rfl:   Allergies   Allergen Reactions  . Penicillins Nausea And Vomiting    Did it involve swelling of the face/tongue/throat, SOB, or low BP? No Did it involve sudden or severe rash/hives, skin peeling, or any reaction on the inside of your mouth or nose? No Did you need to seek medical  attention at a hospital or doctor's office? No When did it last happen?more than 10 years  If all above answers are "NO", may proceed with cephalosporin use.     Depression screen [facility] 2/9 07/07/2019 07/03/2018 01/30/2018 10/29/2017  Decreased Interest 0 0 0 2  Down, Depressed, Hopeless 0 0 0 3  PHQ - 2 Score 0 0 0 5  Altered sleeping 1 - - 2  Tired, decreased energy 0 - - 2  Change in appetite 1 - - 2  Feeling bad or failure about yourself  0 - - 2  Trouble concentrating 2 - - 1  Moving slowly or fidgety/restless 1 - - 1  Suicidal thoughts 0 - - 0  PHQ-9 Score 5 - - 15  Difficult doing work/chores Somewhat difficult - - -     Reviewed their medical, surgical, family, social, medication, and allergy history and updated chart as appropriate.   Review of Systems Constitutional: -fever, -chills, -sweats, -unexpected weight change, -decreased appetite, -fatigue Allergy: -sneezing, -itching, -congestion Dermatology: -changing moles, --rash, -lumps ENT: -runny nose, -ear pain, -sore throat, -hoarseness, -sinus pain, -teeth pain, - ringing in ears, -hearing loss, -nosebleeds Cardiology: -chest pain, -palpitations, -swelling, -difficulty breathing when lying flat, -waking up short of breath Respiratory: -cough, +shortness of breath, -difficulty breathing with exercise or exertion, -wheezing, -coughing up blood Gastroenterology: -abdominal pain, -nausea, -vomiting, -diarrhea, -constipation, -blood in stool, -changes in bowel movement, -difficulty swallowing or eating Hematology: -bleeding, -bruising  Musculoskeletal: -joint aches, -muscle aches, -joint swelling, -back pain, -neck pain, -cramping, -changes in  gait Ophthalmology: denies vision changes, eye redness, itching, discharge Urology: -burning with urination, -difficulty urinating, -blood in urine, -urinary frequency, -urgency, -incontinence Neurology: -headache, -weakness, -tingling, -numbness, -memory loss, -falls, -dizziness Psychology: +depressed mood, -agitation, -sleep problems Breast/gyn: -breast tendnerss, -discharge, -lumps, -vaginal discharge,- irregular periods, -heavy periods     Objective:  BP 140/88   Pulse 82   Temp 98.6 F (37 C)   Ht 5\' 4"  (1.626 m)   Wt 141 lb 12.8 oz (64.3 kg)   SpO2 100%   BMI 24.34 kg/m   General appearance: alert, no distress, WD/WN, African American female Skin: no worrisome lesions Neck: supple, no lymphadenopathy, + thyroid nodules palpated bilat, +mild thyromegaly, no masses, normal ROM, no bruits Chest: non tender, normal shape and expansion Heart: faint 2/6 brief systolic murmur, RRR, normal S1, S2 Lungs: decreased breath sounds, but no wheezes, rhonchi, or rales Abdomen: +bs, soft, non tender, non distended, fullness in lower abdomen suggest of mild to moderate fibroid uterus, no masses, no hepatomegaly, no splenomegaly, no bruits Back: non tender, normal ROM, no scoliosis Musculoskeletal: upper extremities non tender, no obvious deformity, normal ROM throughout, lower extremities non tender, no obvious deformity, normal ROM throughout Extremities: no edema, no cyanosis, no clubbing Pulses: 2+ symmetric, upper and lower extremities, normal cap refill Neurological: alert, oriented x 3, CN2-12 intact, strength normal upper extremities and lower extremities, sensation normal throughout, DTRs 2+ throughout, no cerebellar signs, gait normal Psychiatric: normal affect, behavior normal, pleasant  Breast/gyn/rectal - declined today    Assessment and Plan :   Encounter Diagnoses  Name Primary?  . Encounter for health maintenance examination in adult Yes  . Smoker   . Depression, major,  single episode, mild (HCC)   . Nephrotic syndrome   . SOB (shortness of breath)   . Iron deficiency   . Screening for cervical cancer   . Encounter for screening mammogram for malignant neoplasm of breast   .  Thyroid nodule   . Vaccine counseling   . Hyperlipidemia, unspecified hyperlipidemia type   . Essential hypertension, benign     Physical exam - discussed and counseled on healthy lifestyle, diet, exercise, preventative care, vaccinations, sick and well care, proper use of emergency dept and after hours care, and addressed their concerns.    Health screening: Advised they see their eye doctor yearly for routine vision care. Advised they see their dentist yearly for routine dental care including hygiene visits twice yearly.  Declines STD screen  Routine labs today   Cancer screening Counseled on self breast exams, mammograms, cervical cancer screening  She declines breast and pelvic exam today.  She will return for this on separate visit.  Declines gyn referral.  Possible fibroid uterus based on abdominal exam today  Referred for colonoscopy given age and anemia.  Discussed skin surveillance.    Vaccinations: Advised yearly influenza vaccine, Tetanus every 10 years, covid vaccine, pneumococcal vaccine.   She declines vaccines today but will consider.  She notes tetanus up to date within last 5 years through prior employer.    Separate significant issues discussed: Nephrotic syndrome, hypoalbuminemia -reviewed nephrology notes from [facility], most recent visit note reviewed from 12/2018.  Positive membranous nephropathy, improving with UP/C falling, albumin improving, preserved normal GFR  Hypertension-continue current medication  Hyperlipidemia, nephrotic syndrome -continue Lipitor  Iron deficiency anemia for the past year, reviewed last hematology notes from 2020.  She still has not had a colonoscopy.  We will refer for this now.   she continues on  supplement  Shortness of breath for the past year-reviewed EKG from last year.  She declines EKG today.  I advised to go for chest x-ray.  Advised PFT, she will consider but declines today.  I advised that her shortness of breath probably is not related to her medicines.  She is a smoker.  She also had severe anemia this past year which could have contributed to shortness of breath.  Follow-up pending labs and chest x-ray  Thyroid nodule, abnormal thyroid labs this past year-this was put on the back burner due to the kidney issue and other evaluation she had this past year.  Advised we will likely need to pursue repeat ultrasound pending labs  Depression-we discussed her mood, her recent medication that was initiated within the past 2 weeks by another PCP.  She does not plan to go back there.  We discussed the role of counseling, medication.  We discussed that we are not psychiatrist.  Depending on how she does on this medication we may choose to manage this for now or we might refer to psychiatry.  Follow-up in 3 weeks  Heart murmur-faint, but still present.  Consider echocardiogram  Tobacco use-advised smoking cessation.  Pending labs consider patches or other means to help with tobacco cessation   Grier was seen today for annual exam.  Diagnoses and all orders for this visit:  Encounter for health maintenance examination in adult -     Comprehensive metabolic panel -     CBC with Differential/Platelet -     Lipid panel -     TSH -     T4, free -     Iron -     Ferritin -     MM DIGITAL SCREENING BILATERAL; Future -     Ambulatory referral to Gastroenterology  Smoker -     DG Chest 2 View; Future  Depression, major, single episode, mild (HCC)  Nephrotic  syndrome  SOB (shortness of breath) -     DG Chest 2 View; Future  Iron deficiency  Screening for cervical cancer  Encounter for screening mammogram for malignant neoplasm of breast -     MM DIGITAL SCREENING BILATERAL;  Future  Thyroid nodule  Vaccine counseling  Hyperlipidemia, unspecified hyperlipidemia type  Essential hypertension, benign    Follow-up pending labs, yearly for physical

## 2019-07-08 ENCOUNTER — Other Ambulatory Visit: Payer: Self-pay | Admitting: Medical

## 2019-07-08 ENCOUNTER — Encounter: Payer: Self-pay | Admitting: Medical

## 2019-07-08 DIAGNOSIS — R7989 Other specified abnormal findings of blood chemistry: Secondary | ICD-10-CM

## 2019-07-08 DIAGNOSIS — E079 Disorder of thyroid, unspecified: Secondary | ICD-10-CM

## 2019-07-08 LAB — COMPREHENSIVE METABOLIC PANEL
ALT: 25 IU/L (ref 0–32)
AST: 24 IU/L (ref 0–40)
Albumin/Globulin Ratio: 1.3 (ref 1.2–2.2)
Albumin: 2.8 g/dL — ABNORMAL LOW (ref 3.8–4.8)
Alkaline Phosphatase: 93 IU/L (ref 39–117)
BUN/Creatinine Ratio: 14 (ref 9–23)
BUN: 10 mg/dL (ref 6–24)
Bilirubin Total: <0.2 mg/dL (ref 0.0–1.2)
CO2: 21 mmol/L (ref 20–29)
Calcium: 8.2 mg/dL — ABNORMAL LOW (ref 8.7–10.2)
Chloride: 108 mmol/L — ABNORMAL HIGH (ref 96–106)
Creatinine, Ser: 0.69 mg/dL (ref 0.57–1.00)
GFR calc Af Amer: 122 mL/min/{1.73_m2} (ref >59)
GFR calc non Af Amer: 105 mL/min/{1.73_m2} (ref >59)
Globulin, Total: 2.1 g/dL (ref 1.5–4.5)
Glucose: 85 mg/dL (ref 65–99)
Potassium: 4 mmol/L (ref 3.5–5.2)
Sodium: 139 mmol/L (ref 134–144)
Total Protein: 4.9 g/dL — ABNORMAL LOW (ref 6.0–8.5)

## 2019-07-08 LAB — IRON: Iron: 53 ug/dL (ref 27–159)

## 2019-07-08 LAB — LIPID PANEL
Chol/HDL Ratio: 3 ratio (ref 0.0–4.4)
Cholesterol, Total: 236 mg/dL — ABNORMAL HIGH (ref 100–199)
HDL: 78 mg/dL (ref >39)
LDL Chol Calc (NIH): 141 mg/dL — ABNORMAL HIGH (ref 0–99)
Triglycerides: 99 mg/dL (ref 0–149)
VLDL Cholesterol Cal: 17 mg/dL (ref 5–40)

## 2019-07-08 LAB — CBC WITH DIFFERENTIAL/PLATELET
Basophils Absolute: 0 10*3/uL (ref 0.0–0.2)
Basos: 0 %
EOS (ABSOLUTE): 0.1 10*3/uL (ref 0.0–0.4)
Eos: 1 %
Hematocrit: 31.6 % — ABNORMAL LOW (ref 34.0–46.6)
Hemoglobin: 10 g/dL — ABNORMAL LOW (ref 11.1–15.9)
Immature Grans (Abs): 0 10*3/uL (ref 0.0–0.1)
Immature Granulocytes: 0 %
Lymphocytes Absolute: 2.2 10*3/uL (ref 0.7–3.1)
Lymphs: 35 %
MCH: 27.7 pg (ref 26.6–33.0)
MCHC: 31.6 g/dL (ref 31.5–35.7)
MCV: 88 fL (ref 79–97)
Monocytes Absolute: 0.6 10*3/uL (ref 0.1–0.9)
Monocytes: 9 %
Neutrophils Absolute: 3.5 10*3/uL (ref 1.4–7.0)
Neutrophils: 55 %
Platelets: 468 10*3/uL — ABNORMAL HIGH (ref 150–450)
RBC: 3.61 x10E6/uL — ABNORMAL LOW (ref 3.77–5.28)
RDW: 12.5 % (ref 11.7–15.4)
WBC: 6.4 10*3/uL (ref 3.4–10.8)

## 2019-07-08 LAB — TSH: TSH: 0.456 u[IU]/mL (ref 0.450–4.500)

## 2019-07-08 LAB — FERRITIN: Ferritin: 51 ng/mL (ref 15–150)

## 2019-07-08 LAB — T4, FREE: Free T4: 1.05 ng/dL (ref 0.82–1.77)

## 2019-07-08 MED ORDER — LOSARTAN POTASSIUM 100 MG PO TABS: 100.0000 mg | ORAL_TABLET | Freq: Every day | ORAL | 3 refills | Status: DC

## 2019-07-08 MED ORDER — FERROUS GLUCONATE 324 (38 FE) MG PO TABS: 324.0000 mg | ORAL_TABLET | Freq: Two times a day (BID) | ORAL | 3 refills | Status: DC

## 2019-07-08 MED ORDER — MULTI-VITAMIN/MINERALS PO TABS: 1.0000 | ORAL_TABLET | Freq: Every day | ORAL | 3 refills | Status: AC

## 2019-07-08 MED ORDER — CALCIUM GLUCONATE 500 MG PO TABS: 1.5000 | ORAL_TABLET | Freq: Two times a day (BID) | ORAL | 3 refills | Status: DC

## 2019-07-08 MED ORDER — VITAMIN D 25 MCG (1000 UNIT) PO TABS: 1000.0000 [IU] | ORAL_TABLET | Freq: Every day | ORAL | 3 refills | Status: AC

## 2019-07-08 MED ORDER — NICOTINE 21 MG/24HR TD PT24: 21.0000 mg | MEDICATED_PATCH | Freq: Every day | TRANSDERMAL | 1 refills | Status: AC

## 2019-08-07 ENCOUNTER — Telehealth: Payer: Self-pay

## 2019-08-07 NOTE — Telephone Encounter (Signed)
Pt. Called stating that she was going to pick up her prescription at the [facility] and they told her that her Lamictal has not been approved for refill and she said you guys discussed you filling that medication for her now or if you wanted to change it to something else. She just wanted to see if you could refill it before she goes back to the pharmacy to get her other meds. Pt. Last apt. Was 07/07/19.

## 2019-08-07 NOTE — Telephone Encounter (Signed)
Patient has been informed to call and schedule an appointment  

## 2019-08-07 NOTE — Telephone Encounter (Signed)
Looking for my last office note where we discussed numerous things, my notes say that we are going to see how she was going to do on that medicine before we decided to refill this versus something else.  I also put in the notes that we were going to follow-up in 3 to 4 weeks to discuss concerns from last visit including mood  She is actually due for a follow-up visit virtual or in person.  Virtual if needed but preferably in person.  The purpose of this visit was in part to find out if Lamictal is working or if we need to use something else

## 2019-10-24 ENCOUNTER — Ambulatory Visit: Payer: BC Managed Care – PPO | Admitting: Family Medicine

## 2019-11-06 ENCOUNTER — Emergency Department (HOSPITAL_COMMUNITY): Payer: Medicaid Other

## 2019-11-06 ENCOUNTER — Other Ambulatory Visit: Payer: Self-pay

## 2019-11-06 ENCOUNTER — Emergency Department (HOSPITAL_COMMUNITY)
Admission: EM | Admit: 2019-11-06 | Discharge: 2019-11-06 | Disposition: A | Discharge status: Home / Self Care | Payer: Medicaid Other | Attending: Emergency Medicine | Admitting: Emergency Medicine

## 2019-11-06 DIAGNOSIS — Z79899 Other long term (current) drug therapy: Secondary | ICD-10-CM | POA: Diagnosis not present

## 2019-11-06 DIAGNOSIS — I129 Hypertensive chronic kidney disease with stage 1 through stage 4 chronic kidney disease, or unspecified chronic kidney disease: Secondary | ICD-10-CM | POA: Diagnosis not present

## 2019-11-06 DIAGNOSIS — F419 Anxiety disorder, unspecified: Secondary | ICD-10-CM | POA: Diagnosis not present

## 2019-11-06 DIAGNOSIS — N938 Other specified abnormal uterine and vaginal bleeding: Secondary | ICD-10-CM | POA: Insufficient documentation

## 2019-11-06 DIAGNOSIS — R208 Other disturbances of skin sensation: Secondary | ICD-10-CM | POA: Diagnosis present

## 2019-11-06 DIAGNOSIS — N189 Chronic kidney disease, unspecified: Secondary | ICD-10-CM | POA: Diagnosis not present

## 2019-11-06 DIAGNOSIS — F17210 Nicotine dependence, cigarettes, uncomplicated: Secondary | ICD-10-CM | POA: Insufficient documentation

## 2019-11-06 DIAGNOSIS — Z711 Person with feared health complaint in whom no diagnosis is made: Secondary | ICD-10-CM | POA: Diagnosis not present

## 2019-11-06 LAB — CBC
HCT: 37.1 % (ref 36.0–46.0)
Hemoglobin: 11.7 g/dL — ABNORMAL LOW (ref 12.0–15.0)
MCH: 28.3 pg (ref 26.0–34.0)
MCHC: 31.5 g/dL (ref 30.0–36.0)
MCV: 89.8 fL (ref 80.0–100.0)
Platelets: 420 10*3/uL — ABNORMAL HIGH (ref 150–400)
RBC: 4.13 MIL/uL (ref 3.87–5.11)
RDW: 13.5 % (ref 11.5–15.5)
WBC: 5 10*3/uL (ref 4.0–10.5)
nRBC: 0 % (ref 0.0–0.2)

## 2019-11-06 LAB — COMPREHENSIVE METABOLIC PANEL
ALT: 28 U/L (ref 0–44)
AST: 25 U/L (ref 15–41)
Albumin: 2.9 g/dL — ABNORMAL LOW (ref 3.5–5.0)
Alkaline Phosphatase: 99 U/L (ref 38–126)
Anion gap: 9 (ref 5–15)
BUN: 12 mg/dL (ref 6–20)
CO2: 24 mmol/L (ref 22–32)
Calcium: 9.1 mg/dL (ref 8.9–10.3)
Chloride: 108 mmol/L (ref 98–111)
Creatinine, Ser: 0.91 mg/dL (ref 0.44–1.00)
GFR calc Af Amer: >60 mL/min (ref >60)
GFR calc non Af Amer: >60 mL/min (ref >60)
Glucose, Bld: 110 mg/dL — ABNORMAL HIGH (ref 70–99)
Potassium: 3.4 mmol/L — ABNORMAL LOW (ref 3.5–5.1)
Sodium: 141 mmol/L (ref 135–145)
Total Bilirubin: 0.1 mg/dL — ABNORMAL LOW (ref 0.3–1.2)
Total Protein: 5.7 g/dL — ABNORMAL LOW (ref 6.5–8.1)

## 2019-11-06 LAB — POC URINE PREG, ED: Preg Test, Ur: NEGATIVE

## 2019-11-06 NOTE — ED Notes (Signed)
Pt verbalized understanding of discharge instructions. Follow up care and symptom management reviewed, pt had no further questions. Ambulated independently to lobby.

## 2019-11-06 NOTE — Discharge Instructions (Signed)
You have been seen today for feeling overheated. Please read and follow all provided instructions. Return to the emergency room for worsening condition or new concerning symptoms.    Your lab work was overall normal.  Your EKG did not show signs of a heart attack. Your chest x-ray did not show any signs of infection.  1. Medications:   Continue usual home medications Take medications as prescribed. Please review all of the medicines and only take them if you do not have an allergy to them.   2. Treatment: rest, drink plenty of fluids  3. Follow Up:  Please follow up with primary care provider by scheduling an appointment as soon as possible for a visit     ?

## 2019-11-06 NOTE — ED Triage Notes (Signed)
Patient reports that she has a feeling of getting hot and a feeling like her air isnt circulating correctly. reports when the hot feeling passes she is fine. Started on Lamictal several weeks ago for anxiety after passing of grandson. Speaking full sentences, no distress

## 2019-11-06 NOTE — ED Notes (Signed)
Pt to xray

## 2019-11-06 NOTE — ED Provider Notes (Signed)
[facility] EMERGENCY DEPARTMENT Provider Note   CSN: [id number] Arrival date & time: 11/06/19  0258     History Chief Complaint  Patient presents with  . feeling of overheated    Taylor Booth is a 44 y.o. female with past medical history significant for entrance anemia, anxiety, CKD, hyperlipidemia, hypertension.  HPI Patient presents to emergency room today with chief complaint of intermittent episodes of feeling overheated x 2 weeks. Patient states usually when she is lying in bed at night she will feel very hot.  She states it feels like the air is not circulating in her room.  She states these episodes last approximately 5 minutes.  She admits to having 1-2 episodes per day.  She states she does not have episodes every day however.  With these episodes she states she generally feels unwell however denies any shortness of breath or chest pain at the time.  She has not tried any medications for symptoms prior to arrival.  She is also endorsing vaginal bleeding x1 month.  She states her cycle is usually regular every month however this month she has continued to have spotting.  She has an appointment scheduled with her PCP in 2 weeks for evaluation of this.  She admits to having to change her pad once per day.  She also admits to feeling anxious.  She has been under significant stressors lately including the passing of her grandson.  She was started on Lamictal x4 months ago and feels like that is helping her anxiety.  Denies any recent illness or antibiotic use.  Also denies fever, chills, shortness of breath or difficulty breathing, syncope, palpitations, chest pain, abdominal pain, nausea, vomiting, urinary symptoms, diarrhea, pelvic pain, vaginal discharge. She denies any suicidal or homicidal ideations.    Past Medical History:  Diagnosis Date  . Allergy   . Anemia 2020   iron deficiency, eval Dr. Betsy Coder   . Anxiety   . Chronic kidney disease 2020    nephrotic syndrome, hypoalbuminemia .  Dr. Pearson Grippe, [facility]  . Depression 10/2017  . Hyperlipidemia 10/2017  . Hypertension 10/2017  . Thyroid nodule    2020, recommended biopsy, but declined at the time  . Wears glasses     Patient Active Problem List   Diagnosis Date Noted  . Depression, major, single episode, mild ([place]) 07/07/2019  . Smoker 07/07/2019  . Dermatitis 07/24/2018  . Nephrotic syndrome 07/24/2018  . Hypoalbuminemia 07/04/2018  . Thyroid mass 07/04/2018  . Abnormal thyroid blood test 07/04/2018  . Proteinuria 07/04/2018  . Encounter for health maintenance examination in adult 07/03/2018  . Essential hypertension 07/03/2018  . Edema 07/03/2018  . Murmur 07/03/2018  . Anemia 07/03/2018  . Vaccine counseling 07/03/2018    Past Surgical History:  Procedure Laterality Date  . BUNIONECTOMY     right  . CESAREAN SECTION     x3, 2003, 1997, 1991  . RENAL BIOPSY  2020     OB History   No obstetric history on file.     Family History  Problem Relation Age of Onset  . Diabetes Mother   . Cancer Father        liver  . Diabetes Maternal Grandmother     Social History   Tobacco Use  . Smoking status: Current Every Day Smoker    Packs/day: 0.50    Years: 11.00    Pack years: 5.50    Types: Cigarettes    Last attempt to  quit: 07/03/2017    Years since quitting: 2.3  . Smokeless tobacco: Never Used  . Tobacco comment: quit 48 days ago  Vaping Use  . Vaping Use: Never used  Substance Use Topics  . Alcohol use: Yes    Alcohol/week: 2.0 standard drinks    Types: 2 Glasses of wine per week  . Drug use: Never    Home Medications Prior to Admission medications   Medication Sig Start Date End Date Taking? Authorizing Provider  atorvastatin (LIPITOR) 40 MG tablet TAKE 1 TABLET(40 MG) BY MOUTH DAILY 07/04/19   Tysinger, Kermit Balo, PA-C  calcium gluconate 500 MG tablet Take 1.5 tablets (750 mg total) by mouth 2 (two) times daily. 07/08/19    Tysinger, Kermit Balo, PA-C  cholecalciferol (VITAMIN D3) 25 MCG (1000 UNIT) tablet Take 1 tablet (1,000 Units total) by mouth daily. 07/08/19   Tysinger, Kermit Balo, PA-C  ferrous gluconate (FERGON) 324 MG tablet Take 1 tablet (324 mg total) by mouth 2 (two) times daily with a meal. 07/08/19   Tysinger, Kermit Balo, PA-C  lamoTRIgine (LAMICTAL) 25 MG tablet Take 25 mg by mouth daily. Take 1 tablet daily for 2 weeks, then increase to 1 twice daily.    [provider]  losartan (COZAAR) 100 MG tablet Take 1 tablet (100 mg total) by mouth at bedtime. 07/08/19   Tysinger, Kermit Balo, PA-C  Multiple Vitamins-Minerals (MULTIVITAMIN WITH MINERALS) tablet Take 1 tablet by mouth daily. 07/08/19   Tysinger, Kermit Balo, PA-C  nicotine (NICODERM CQ - DOSED IN MG/24 HOURS) 21 mg/24hr patch Place 1 patch (21 mg total) onto the skin daily. 07/08/19 07/07/20  Tysinger, Kermit Balo, PA-C    Allergies    Penicillins  Review of Systems   Review of Systems All other systems are reviewed and are negative for acute change except as noted in the HPI.  Physical Exam Updated Vital Signs BP (!) 163/90 (BP Location: Left Arm)   Pulse 87   Temp 98.6 F (37 C) (Oral)   Resp 18   Ht 5' 3.5" (1.613 m)   Wt 70.3 kg   SpO2 100%   BMI 27.03 kg/m   Physical Exam Vitals and nursing note reviewed.  Constitutional:      General: She is not in acute distress.    Appearance: She is not ill-appearing.  HENT:     Head: Normocephalic and atraumatic.     Right Ear: Tympanic membrane and external ear normal.     Left Ear: Tympanic membrane and external ear normal.     Nose: Nose normal.     Mouth/Throat:     Mouth: Mucous membranes are moist.     Pharynx: Oropharynx is clear.  Eyes:     General: No scleral icterus.       Right eye: No discharge.        Left eye: No discharge.     Extraocular Movements: Extraocular movements intact.     Conjunctiva/sclera: Conjunctivae normal.     Pupils: Pupils are equal, round, and reactive to  light.  Neck:     Vascular: No JVD.  Cardiovascular:     Rate and Rhythm: Normal rate and regular rhythm.     Pulses: Normal pulses.          Radial pulses are 2+ on the right side and 2+ on the left side.     Heart sounds: Normal heart sounds.  Pulmonary:     Comments: Lungs clear to auscultation in all  fields. Symmetric chest rise. No wheezing, rales, or rhonchi. Abdominal:     General: Bowel sounds are normal.     Tenderness: There is no right CVA tenderness or left CVA tenderness.     Comments: Abdomen is soft, non-distended, and non-tender in all quadrants. No rigidity, no guarding. No peritoneal signs.  Musculoskeletal:        General: Normal range of motion.     Cervical back: Normal range of motion.     Right lower leg: No edema.     Left lower leg: No edema.  Skin:    General: Skin is warm and dry.     Capillary Refill: Capillary refill takes less than 2 seconds.  Neurological:     Mental Status: She is oriented to person, place, and time.     GCS: GCS eye subscore is 4. GCS verbal subscore is 5. GCS motor subscore is 6.     Comments: Speech is clear and goal oriented, follows commands CN III-XII intact, no facial droop Normal strength in upper and lower extremities bilaterally including dorsiflexion and plantar flexion, strong and equal grip strength Sensation normal to light and sharp touch Moves extremities without ataxia, coordination intact Normal finger to nose and rapid alternating movements Normal gait and balance   Psychiatric:        Mood and Affect: Mood is anxious.        Behavior: Behavior normal.     ED Results / Procedures / Treatments   Labs (all labs ordered are listed, but only abnormal results are displayed) Labs Reviewed  COMPREHENSIVE METABOLIC PANEL - Abnormal; Notable for the following components:      Result Value   Potassium 3.4 (*)    Glucose, Bld 110 (*)    Total Protein 5.7 (*)    Albumin 2.9 (*)    Total Bilirubin 0.1 (*)    All  other components within normal limits  CBC - Abnormal; Notable for the following components:   Hemoglobin 11.7 (*)    Platelets 420 (*)    All other components within normal limits  POC URINE PREG, ED    EKG EKG Interpretation  Date/Time:  Thursday November 06 2019 14:47:33 EDT Ventricular Rate:  82 PR Interval:    QRS Duration: 94 QT Interval:  383 QTC Calculation: 448 R Axis:   62 Text Interpretation: Sinus rhythm Consider left ventricular hypertrophy Nonspecific T abnormalities, anterior leads Baseline wander Abnormal ECG Confirmed by Gerhard Munch [phone]) on 11/06/2019 3:12:42 PM   Radiology DG Chest 2 View  Result Date: 11/06/2019 CLINICAL DATA:  Hypertension. EXAM: CHEST - 2 VIEW COMPARISON:  None. FINDINGS: The heart size and mediastinal contours are within normal limits. Both lungs are clear. The visualized skeletal structures are unremarkable. IMPRESSION: Normal exam. Electronically Signed   By: Francene Boyers M.D.   On: 11/06/2019 14:28    Procedures Procedures (including critical care time)  Medications Ordered in ED Medications - No data to display  ED Course  I have reviewed the triage vital signs and the nursing notes.  Pertinent labs & imaging results that were available during my care of the patient were reviewed by me and considered in my medical decision making (see chart for details).   Vitals:   11/06/19 0855 11/06/19 1149 11/06/19 1411 11/06/19 1451  BP: (!) 195/108 (!) 163/90 (!) 197/95 (!) 170/102  Pulse: 87 87  74  Resp: 16 18  14   Temp: 98.6 F (37 C)  TempSrc: Oral     SpO2: 100% 100%  100%  Weight: 70.3 kg     Height: 5' 3.5" (1.613 m)         MDM Rules/Calculators/A&P                          History provided by patient with additional history obtained from chart review.    Patient seen and examined. Patient presents awake, alert, hemodynamically stable, afebrile, non toxic.  Lungs are clear to auscultation all fields, she has no  abdominal tenderness.  No peritoneal signs.  She is anxious however denies any suicidal homicidal ideations.  She feels safe at home.  I checked her blood pressure and it was 197/95.  She has a normal neuro exam.  She denies any headache, numbness, weakness or tingling.  Labs were collected in triage.  I viewed results which show no leukocytosis, hemoglobin 11.7 is consistent with her baseline and known diagnosis of anemia.  She is on iron supplements.  She has no severe electrolyte derangement, no renal insufficiency. No signs of end organ damage. Urine pregnancy test is negative. I viewed pt's chest xray and it does not suggest acute infectious processes. EKG without acute ischemia.  Given reassuring work-up and exam feel that patient can be discharged home.  She could very likely be in the early phases of menopause.  Patient has an appointment scheduled with her PCP in 2 weeks.  I recommend she keep this appointment to further discuss the vaginal bleeding and hypertension.  Also advised patient to keep a daily log of her blood pressure to closely monitor BP.  The patient appears reasonably screened and/or stabilized for discharge and I doubt any other medical condition or other [facility] requiring further screening, evaluation, or treatment in the ED at this time prior to discharge. The patient is safe for discharge with strict return precautions discussed. Recommend pcp follow up. Findings and plan of care discussed with supervising physician Dr. Jeraldine Loots.   Portions of this note were generated with Scientist, clinical (histocompatibility and immunogenetics). Dictation errors may occur despite best attempts at proofreading.   Final Clinical Impression(s) / ED Diagnoses Final diagnoses:  Physically well but worried    Rx / DC Orders ED Discharge Orders    None       Kathyrn Lass 11/06/19 1537    Gerhard Munch, MD 11/06/19 1540

## 2019-11-13 ENCOUNTER — Other Ambulatory Visit: Payer: Self-pay | Admitting: Medical

## 2019-11-13 DIAGNOSIS — E7841 Elevated Lipoprotein(a): Secondary | ICD-10-CM

## 2019-11-13 NOTE — Telephone Encounter (Signed)
Pt. Requesting refill on her Atorvastatin last apt was 07/07/19 and has no future apt.

## 2019-11-27 IMAGING — US ULTRASOUND CORE BIOPSY
1 series · 7 of 7 positions shown · non-contrast
Comparison: none

INDICATION: 43-year-old female with a history proteinuria

[Series 1: ultrasound core biopsy · 7 of 7 slices shown]
[im 1/7]
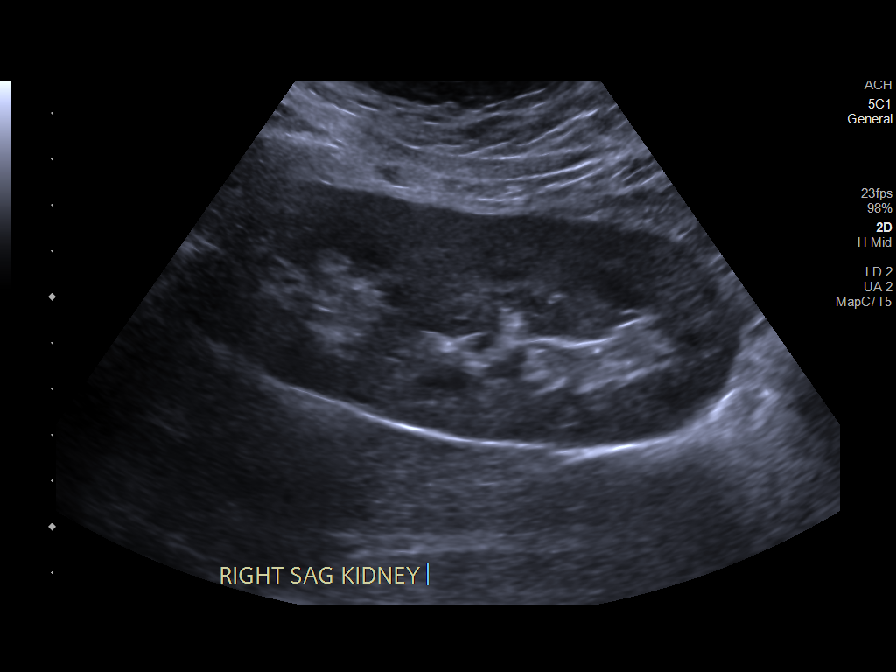
[im 2/7]
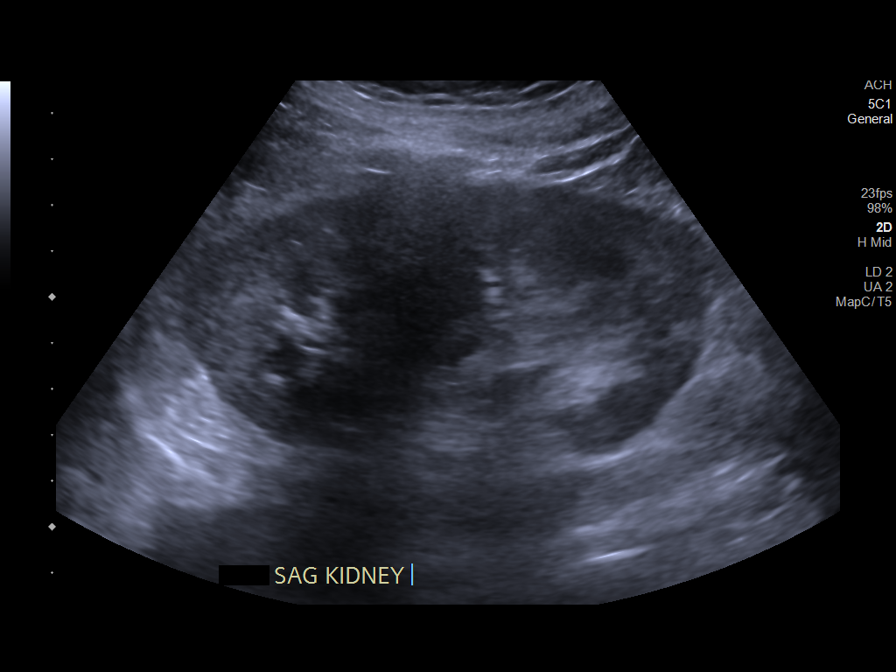
[im 3/7]
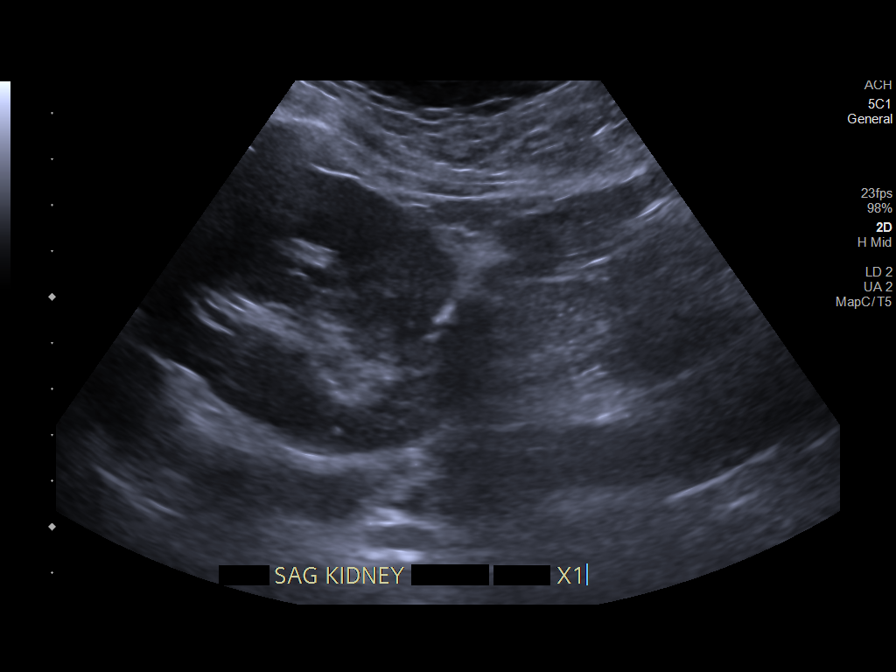
[im 4/7]
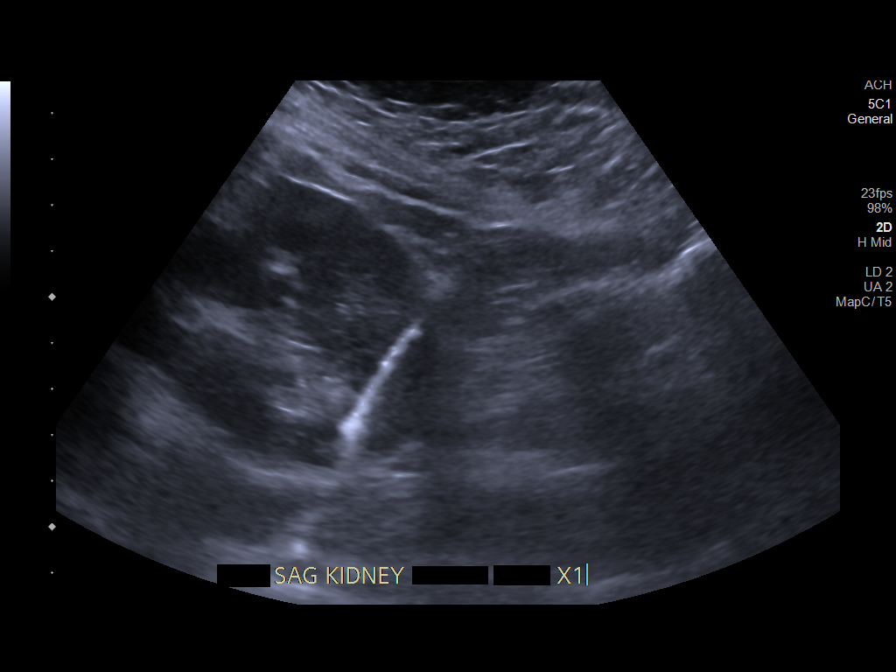
[im 5/7]
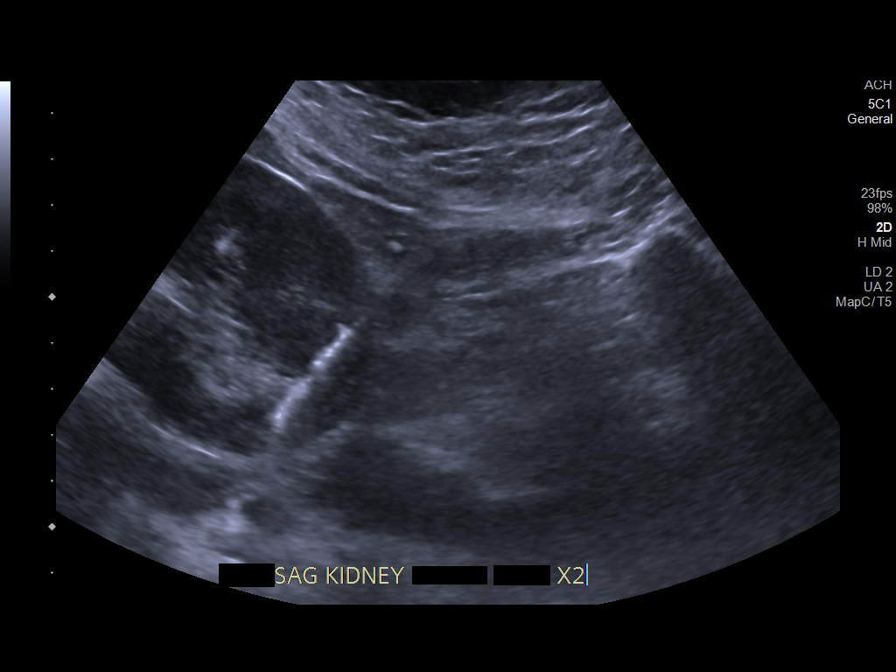
[im 6/7]
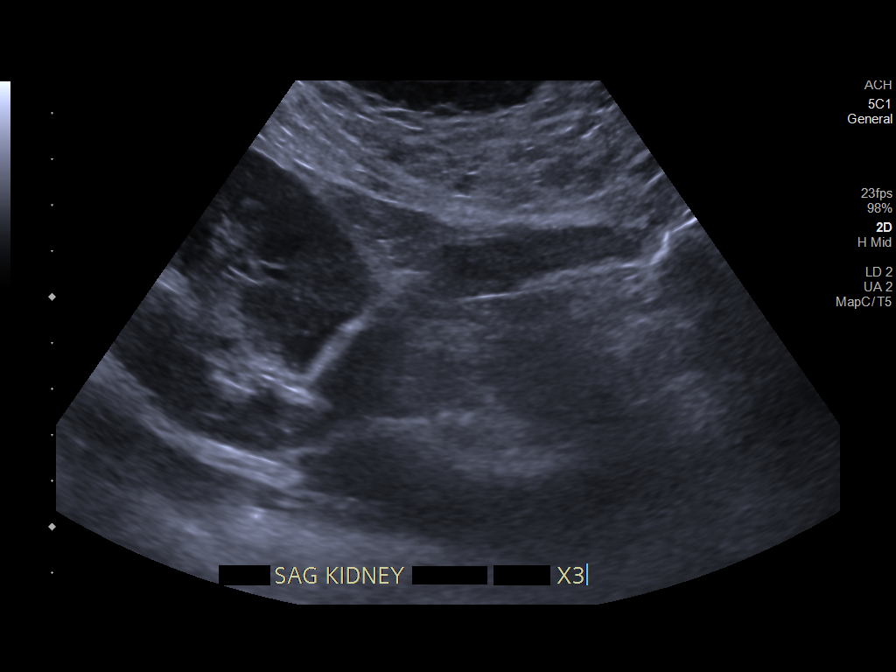
[im 7/7]
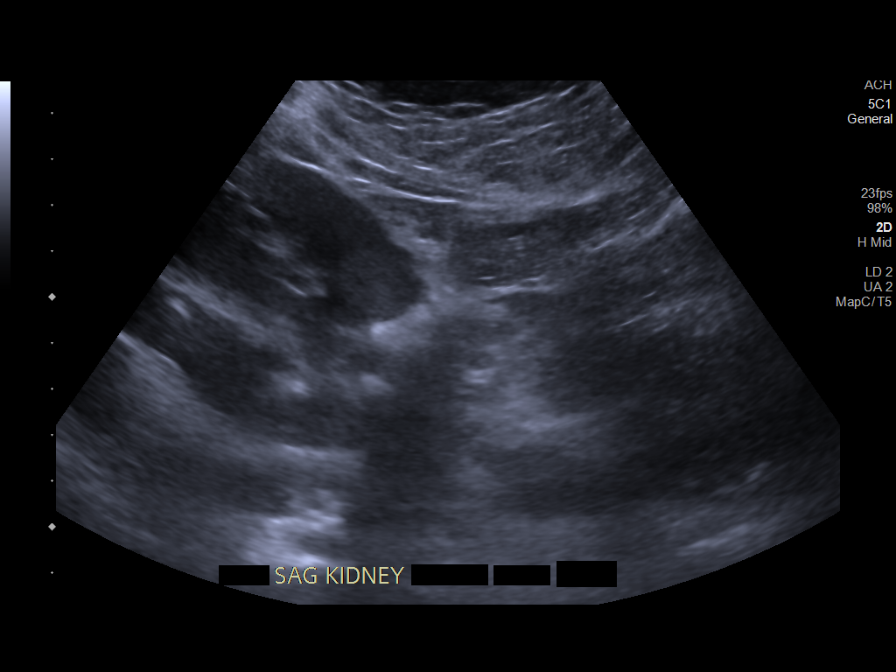

[7 of 7 positions shown; findings below may reference images not displayed]

EXAM:
IMAGE GUIDED BIOPSY LEFT KIDNEY

MEDICATIONS:
None.

ANESTHESIA/SEDATION:
Moderate (conscious) sedation was employed during this procedure. A
total of Versed 2.0 mg and Fentanyl 100 mcg was administered
intravenously.

Moderate Sedation Time: 11 minutes. The patient's level of
consciousness and vital signs were monitored continuously by
radiology nursing throughout the procedure under my direct
supervision.

FLUOROSCOPY TIME:  None

COMPLICATIONS:
None

PROCEDURE:
Informed written consent was obtained from the patient after a
thorough discussion of the procedural risks, benefits and
alternatives. All questions were addressed. Maximal Sterile Barrier
Technique was utilized including caps, mask, sterile gowns, sterile
gloves, sterile drape, hand hygiene and skin antiseptic. A timeout
was performed prior to the initiation of the procedure.

Patient was positioned prone position on the gantry table. Images
were stored sent to PACs.

Once the patient is prepped and draped in the usual sterile fashion,
the skin and subcutaneous tissues overlying the left kidney were
generously infiltrated 1% lidocaine for local anesthesia.

Using ultrasound guidance, a 15 gauge guide needle was advanced into
the lower cortex of the left kidney.

Once we confirmed location of the needle tip, 2 separate 16 gauge
core biopsy were achieved.

Two Gel-Foam pledgets were infused with a small amount of saline.
The needle was removed.

Final images were stored.

The patient tolerated the procedure well and remained
hemodynamically stable throughout.

No complications were encountered and no significant blood loss
encountered.
IMPRESSION: Status post ultrasound-guided medical kidney biopsy of the left
kidney.

## 2020-03-29 ENCOUNTER — Telehealth: Payer: Self-pay | Admitting: Family Medicine

## 2020-03-29 NOTE — Telephone Encounter (Signed)
Pt called and needed copy of 10/2018 letter emailed to her at [email or web address] and she will pick up original

## 2020-05-06 ENCOUNTER — Other Ambulatory Visit: Payer: Self-pay | Admitting: Medical

## 2020-05-06 DIAGNOSIS — E7841 Elevated Lipoprotein(a): Secondary | ICD-10-CM

## 2020-06-21 ENCOUNTER — Other Ambulatory Visit: Payer: Self-pay | Admitting: Family Medicine

## 2020-06-21 DIAGNOSIS — N950 Postmenopausal bleeding: Secondary | ICD-10-CM

## 2021-01-03 ENCOUNTER — Other Ambulatory Visit: Payer: Self-pay | Admitting: Family Medicine

## 2021-01-03 DIAGNOSIS — N950 Postmenopausal bleeding: Secondary | ICD-10-CM

## 2021-01-18 ENCOUNTER — Ambulatory Visit
Admission: RE | Admit: 2021-01-18 | Discharge: 2021-01-18 | Disposition: A | Discharge status: Home / Self Care | Payer: Medicaid Other | Source: Ambulatory Visit | Attending: Family Medicine | Admitting: Family Medicine

## 2021-01-18 DIAGNOSIS — N950 Postmenopausal bleeding: Secondary | ICD-10-CM

## 2023-01-22 ENCOUNTER — Encounter: Payer: Self-pay | Admitting: Family Medicine

## 2023-01-22 ENCOUNTER — Ambulatory Visit (INDEPENDENT_AMBULATORY_CARE_PROVIDER_SITE_OTHER): Payer: 59 | Admitting: Family Medicine

## 2023-01-22 VITALS — BP 146/88 | HR 83 | Temp 98.3°F | Resp 16 | Ht 63.5 in | Wt 171.8 lb

## 2023-01-22 DIAGNOSIS — Z7689 Persons encountering health services in other specified circumstances: Secondary | ICD-10-CM | POA: Diagnosis not present

## 2023-01-22 DIAGNOSIS — E785 Hyperlipidemia, unspecified: Secondary | ICD-10-CM

## 2023-01-22 DIAGNOSIS — I10 Essential (primary) hypertension: Secondary | ICD-10-CM

## 2023-01-22 DIAGNOSIS — F32A Depression, unspecified: Secondary | ICD-10-CM | POA: Diagnosis not present

## 2023-01-22 MED ORDER — BUPROPION HCL ER (XL) 300 MG PO TB24: 300.0000 mg | ORAL_TABLET | Freq: Every day | ORAL | 1 refills | Status: DC

## 2023-01-22 NOTE — Progress Notes (Unsigned)
Establish care

## 2023-01-22 NOTE — Progress Notes (Unsigned)
New Patient Office Visit  Subjective    Patient ID: Taylor Booth, female    DOB: 06/25/1975  Age: 47 y.o. MRN: [id number]  CC:  Chief Complaint  Patient presents with   Establish Care    HPI Taylor Booth presents to establish care ***  Outpatient Encounter Medications as of 01/22/2023  Medication Sig   atorvastatin (LIPITOR) 40 MG tablet TAKE 1 TABLET(40 MG) BY MOUTH DAILY   cholecalciferol (VITAMIN D3) 25 MCG (1000 UNIT) tablet Take 1 tablet (1,000 Units total) by mouth daily.   lamoTRIgine (LAMICTAL) 25 MG tablet Take 25 mg by mouth daily. Take 1 tablet daily for 2 weeks, then increase to 1 twice daily.   losartan (COZAAR) 100 MG tablet Take 1 tablet (100 mg total) by mouth at bedtime.   Multiple Vitamins-Minerals (MULTIVITAMIN WITH MINERALS) tablet Take 1 tablet by mouth daily.   calcium gluconate 500 MG tablet Take 1.5 tablets (750 mg total) by mouth 2 (two) times daily. (Patient not taking: Reported on 01/22/2023)   ferrous gluconate (FERGON) 324 MG tablet Take 1 tablet (324 mg total) by mouth 2 (two) times daily with a meal. (Patient not taking: Reported on 01/22/2023)   No facility-administered encounter medications on file as of 01/22/2023.    Past Medical History:  Diagnosis Date   Allergy    Anemia 2020   iron deficiency, eval Dr. Thornton Papas    Anxiety    Chronic kidney disease 2020   nephrotic syndrome, hypoalbuminemia .  Dr. Sabra Heck, [facility]   Depression 10/2017   Hyperlipidemia 10/2017   Hypertension 10/2017   Thyroid nodule    2020, recommended biopsy, but declined at the time   Wears glasses     Past Surgical History:  Procedure Laterality Date   BUNIONECTOMY     right   CESAREAN SECTION     x3, 2003, 1997, 1991   RENAL BIOPSY  2020    Family History  Problem Relation Age of Onset   Diabetes Mother    Cancer Father        liver   Diabetes Maternal Grandmother     Social History   Socioeconomic History   Marital status:  Single    Spouse name: Not on file   Number of children: Not on file   Years of education: Not on file   Highest education level: Not on file  Occupational History   Not on file  Tobacco Use   Smoking status: Former    Current packs/day: 0.00    Average packs/day: 0.5 packs/day for 11.0 years (5.5 ttl pk-yrs)    Types: Cigarettes    Start date: 07/03/2006    Quit date: 07/03/2017    Years since quitting: 5.5   Smokeless tobacco: Never   Tobacco comments:    quit 48 days ago  Vaping Use   Vaping status: Never Used  Substance and Sexual Activity   Alcohol use: Yes    Alcohol/week: 2.0 standard drinks of alcohol    Types: 2 Glasses of wine per week   Drug use: Never   Sexual activity: Yes  Other Topics Concern   Not on file  Social History Narrative   Lives with daughter, currently unemployed, looking for work.  Exercise - walk. 06/2019.   Social Determinants of Health   Financial Resource Strain: High Risk (01/22/2023)   Overall Financial Resource Strain (CARDIA)    Difficulty of Paying Living Expenses: Hard  Food Insecurity: Food Insecurity Present (01/22/2023)  Hunger Vital Sign    Worried About Running Out of Food in the Last Year: Often true    Ran Out of Food in the Last Year: Often true  Transportation Needs: No Transportation Needs (01/22/2023)   PRAPARE - Administrator, Civil Service (Medical): No    Lack of Transportation (Non-Medical): No  Physical Activity: Sufficiently Active (01/22/2023)   Exercise Vital Sign    Days of Exercise per Week: 6 days    Minutes of Exercise per Session: 30 min  Stress: Stress Concern Present (01/22/2023)   [employer] of Occupational Health - Occupational Stress Questionnaire    Feeling of Stress : To some extent  Social Connections: Moderately Isolated (01/22/2023)   Social Connection and Isolation Panel [NHANES]    Frequency of Communication with Friends and Family: Twice a week    Frequency of Social Gatherings with  Friends and Family: Once a week    Attends Religious Services: 1 to 4 times per year    Active Member of [employer] or Organizations: No    Attends Banker Meetings: Never    Marital Status: Never married  Intimate Partner Violence: Not At Risk (01/22/2023)   Humiliation, Afraid, Rape, and Kick questionnaire    Fear of Current or Ex-Partner: No    Emotionally Abused: No    Physically Abused: No    Sexually Abused: No    ROS      Objective    BP (!) 146/88 (BP Location: Right Arm, Patient Position: Sitting, Cuff Size: Normal)   Pulse 83   Temp 98.3 F (36.8 C) (Oral)   Resp 16   Ht 5' 3.5" (1.613 m)   Wt 171 lb 12.8 oz (77.9 kg)   SpO2 99%   BMI 29.96 kg/m   Physical Exam  {Labs (Optional):23779}    Assessment & Plan:   There are no diagnoses linked to this encounter.   No follow-ups on file.   Tommie Raymond, MD

## 2023-01-23 ENCOUNTER — Encounter: Payer: Self-pay | Admitting: Family Medicine

## 2023-02-08 ENCOUNTER — Other Ambulatory Visit: Payer: Self-pay | Admitting: Family Medicine

## 2023-02-08 DIAGNOSIS — I10 Essential (primary) hypertension: Secondary | ICD-10-CM

## 2023-02-08 NOTE — Telephone Encounter (Signed)
Requested by interface surescripts. Medication discontinued 11/07/18.  Requested Prescriptions  Pending Prescriptions Disp Refills   losartan (COZAAR) 100 MG tablet 90 tablet 3    Sig: Take 1 tablet (100 mg total) by mouth at bedtime.     Cardiovascular:  Angiotensin Receptor Blockers Failed - 02/08/2023  8:58 AM      Failed - Cr in normal range and within 180 days    Creatinine, Ser  Date Value Ref Range Status  11/06/2019 0.91 0.44 - 1.00 mg/dL Final         Failed - K in normal range and within 180 days    Potassium  Date Value Ref Range Status  11/06/2019 3.4 (L) 3.5 - 5.1 mmol/L Final         Failed - Last BP in normal range    BP Readings from Last 1 Encounters:  01/22/23 (!) 146/88         Passed - Patient is not pregnant      Passed - Valid encounter within last 6 months    Recent Outpatient Visits           2 weeks ago Essential hypertension   [place] [facility], MD   5 years ago Hypertension, unspecified type   [place] [facility] Loletta Specter, PA-C   5 years ago Abnormal TSH   [place] [facility] Loletta Specter, PA-C   5 years ago Elevated blood-pressure reading without diagnosis of hypertension   [place] [facility] Loletta Specter, PA-C       Future Appointments             In 2 weeks Georganna Skeans, MD [facility]            Refused Prescriptions Disp Refills   amLODipine (NORVASC) 10 MG tablet 90 tablet 0    Sig: Take 1 tablet (10 mg total) by mouth daily.     Cardiovascular: Calcium Channel Blockers 2 Failed - 02/08/2023  8:58 AM      Failed - Last BP in normal range    BP Readings from Last 1 Encounters:  01/22/23 (!) 146/88         Passed - Last Heart Rate in normal range    Pulse Readings from Last 1 Encounters:  01/22/23 83         Passed - Valid encounter within last 6 months    Recent  Outpatient Visits           2 weeks ago Essential hypertension   [place] [facility], MD   5 years ago Hypertension, unspecified type   [place] [facility] Loletta Specter, PA-C   5 years ago Abnormal TSH   [place] [facility] Loletta Specter, PA-C   5 years ago Elevated blood-pressure reading without diagnosis of hypertension   [place] [facility] Loletta Specter, PA-C       Future Appointments             In 2 weeks Georganna Skeans, MD [facility]

## 2023-02-08 NOTE — Telephone Encounter (Signed)
Requested medication (s) are due for refill today: expired medication  Requested medication (s) are on the active medication list: yes   Last refill:  07/08/19 #90 3 refills  Future visit scheduled: no   Notes to clinic:  expired medication. Last ordered by Kermit Balo. Tysinger, [place] 07/08/19. Do you want to order Rx?     Requested Prescriptions  Pending Prescriptions Disp Refills   losartan (COZAAR) 100 MG tablet 90 tablet 3    Sig: Take 1 tablet (100 mg total) by mouth at bedtime.     Cardiovascular:  Angiotensin Receptor Blockers Failed - 02/08/2023  8:58 AM      Failed - Cr in normal range and within 180 days    Creatinine, Ser  Date Value Ref Range Status  11/06/2019 0.91 0.44 - 1.00 mg/dL Final         Failed - K in normal range and within 180 days    Potassium  Date Value Ref Range Status  11/06/2019 3.4 (L) 3.5 - 5.1 mmol/L Final         Failed - Last BP in normal range    BP Readings from Last 1 Encounters:  01/22/23 (!) 146/88         Passed - Patient is not pregnant      Passed - Valid encounter within last 6 months    Recent Outpatient Visits           2 weeks ago Essential hypertension   [place] [facility], MD   5 years ago Hypertension, unspecified type   [place] [facility] Loletta Specter, PA-C   5 years ago Abnormal TSH   [place] [facility] Loletta Specter, PA-C   5 years ago Elevated blood-pressure reading without diagnosis of hypertension   [place] [facility] Loletta Specter, PA-C       Future Appointments             In 2 weeks Georganna Skeans, MD [facility]            Refused Prescriptions Disp Refills   amLODipine (NORVASC) 10 MG tablet 90 tablet 0    Sig: Take 1 tablet (10 mg total) by mouth daily.     Cardiovascular: Calcium Channel Blockers 2 Failed - 02/08/2023  8:58 AM      Failed - Last BP  in normal range    BP Readings from Last 1 Encounters:  01/22/23 (!) 146/88         Passed - Last Heart Rate in normal range    Pulse Readings from Last 1 Encounters:  01/22/23 83         Passed - Valid encounter within last 6 months    Recent Outpatient Visits           2 weeks ago Essential hypertension   [place] [facility], MD   5 years ago Hypertension, unspecified type   [place] [facility] Loletta Specter, PA-C   5 years ago Abnormal TSH   [place] [facility] Loletta Specter, PA-C   5 years ago Elevated blood-pressure reading without diagnosis of hypertension   [place] [facility] Loletta Specter, PA-C       Future Appointments             In 2 weeks Georganna Skeans,  [place] [facility]

## 2023-02-08 NOTE — Telephone Encounter (Signed)
Medication Refill - Medication:  Amlodipine 5MG   losartan (COZAAR) 100 MG tablet  *has about 5 tablets of each left   Has the patient contacted their pharmacy? Yes, advised to contact PCP  Preferred Pharmacy (with phone number or street name):  [facility] [phone] [place], [place] - [address]  Phone: [phone] Fax: [phone]   Has the patient been seen for an appointment in the last year OR does the patient have an upcoming appointment? YES. F/U Scheduled for 02/22/23 with PCP

## 2023-02-13 NOTE — Telephone Encounter (Signed)
Pt called [place]. She is wondering why her medication has not been refilled. I did explain that the refill needed to be approved by PCP since a different provider last signed this refill.  Pt also states that she needs amlodipine refilled.

## 2023-02-22 ENCOUNTER — Ambulatory Visit (INDEPENDENT_AMBULATORY_CARE_PROVIDER_SITE_OTHER): Payer: 59 | Admitting: Family Medicine

## 2023-02-22 ENCOUNTER — Encounter: Payer: Self-pay | Admitting: Family Medicine

## 2023-02-22 VITALS — BP 185/102 | HR 79 | Temp 98.1°F | Resp 18 | Ht 63.0 in | Wt 174.8 lb

## 2023-02-22 DIAGNOSIS — I10 Essential (primary) hypertension: Secondary | ICD-10-CM | POA: Diagnosis not present

## 2023-02-22 DIAGNOSIS — Z23 Encounter for immunization: Secondary | ICD-10-CM | POA: Diagnosis not present

## 2023-02-22 MED ORDER — AMLODIPINE BESYLATE 10 MG PO TABS: 10.0000 mg | ORAL_TABLET | Freq: Every day | ORAL | 0 refills | Status: DC

## 2023-02-22 MED ORDER — LOSARTAN POTASSIUM-HCTZ 100-25 MG PO TABS: 1.0000 | ORAL_TABLET | Freq: Every day | ORAL | 0 refills | Status: DC

## 2023-02-26 ENCOUNTER — Encounter: Payer: Self-pay | Admitting: Family Medicine

## 2023-02-26 NOTE — Progress Notes (Signed)
Established Patient Office Visit  Subjective    Patient ID: Taylor Booth, female    DOB: [birth year]  Age: 47 y.o. MRN: [id number]  CC:  Chief Complaint  Patient presents with   Follow-up    Needs b/p refill     HPI Taylor Booth presents for follow up of hypertension. Patient denies acute complaints.   Outpatient Encounter Medications as of 02/22/2023  Medication Sig   amLODipine (NORVASC) 10 MG tablet Take 1 tablet (10 mg total) by mouth daily.   losartan-hydrochlorothiazide (HYZAAR) 100-12.5 MG tablet Take 1 tablet by mouth daily.   losartan-hydrochlorothiazide (HYZAAR) 100-25 MG tablet Take 1 tablet by mouth daily.   [DISCONTINUED] amLODipine (NORVASC) 5 MG tablet Take 5 mg by mouth daily.   atorvastatin (LIPITOR) 40 MG tablet TAKE 1 TABLET(40 MG) BY MOUTH DAILY (Patient not taking: Reported on 02/22/2023)   buPROPion (WELLBUTRIN XL) 300 MG 24 hr tablet Take 1 tablet (300 mg total) by mouth daily.   calcium gluconate 500 MG tablet Take 1.5 tablets (750 mg total) by mouth 2 (two) times daily. (Patient not taking: Reported on 01/22/2023)   cholecalciferol (VITAMIN D3) 25 MCG (1000 UNIT) tablet Take 1 tablet (1,000 Units total) by mouth daily.   ferrous gluconate (FERGON) 324 MG tablet Take 1 tablet (324 mg total) by mouth 2 (two) times daily with a meal. (Patient not taking: Reported on 01/22/2023)   lamoTRIgine (LAMICTAL) 25 MG tablet Take 25 mg by mouth daily. Take 1 tablet daily for 2 weeks, then increase to 1 twice daily. (Patient not taking: Reported on 02/22/2023)   losartan (COZAAR) 100 MG tablet Take 1 tablet (100 mg total) by mouth at bedtime. (Patient not taking: Reported on 02/22/2023)   Multiple Vitamins-Minerals (MULTIVITAMIN WITH MINERALS) tablet Take 1 tablet by mouth daily.   No facility-administered encounter medications on file as of 02/22/2023.    Past Medical History:  Diagnosis Date   Allergy    Anemia 2020   iron deficiency, eval Dr. Thornton Papas     Anxiety    Chronic kidney disease 2020   nephrotic syndrome, hypoalbuminemia .  Dr. Sabra Heck, [facility]   Depression 10/2017   Hyperlipidemia 10/2017   Hypertension 10/2017   Thyroid nodule    2020, recommended biopsy, but declined at the time   Wears glasses     Past Surgical History:  Procedure Laterality Date   BUNIONECTOMY     right   CESAREAN SECTION     x3, 2003, 1997, 1991   RENAL BIOPSY  2020    Family History  Problem Relation Age of Onset   Diabetes Mother    Cancer Father        liver   Diabetes Maternal Grandmother     Social History   Socioeconomic History   Marital status: Single    Spouse name: Not on file   Number of children: Not on file   Years of education: Not on file   Highest education level: Not on file  Occupational History   Not on file  Tobacco Use   Smoking status: Former    Current packs/day: 0.00    Average packs/day: 0.5 packs/day for 11.0 years (5.5 ttl pk-yrs)    Types: Cigarettes    Start date: 07/03/2006    Quit date: 07/03/2017    Years since quitting: 5.6   Smokeless tobacco: Never   Tobacco comments:    quit 48 days ago  Vaping Use   Vaping status: Never  Used  Substance and Sexual Activity   Alcohol use: Yes    Alcohol/week: 2.0 standard drinks of alcohol    Types: 2 Glasses of wine per week   Drug use: Never   Sexual activity: Yes  Other Topics Concern   Not on file  Social History Narrative   Lives with daughter, currently unemployed, looking for work.  Exercise - walk. 06/2019.   Social Determinants of Health   Financial Resource Strain: High Risk (01/22/2023)   Overall Financial Resource Strain (CARDIA)    Difficulty of Paying Living Expenses: Hard  Food Insecurity: Food Insecurity Present (01/22/2023)   Hunger Vital Sign    Worried About Running Out of Food in the Last Year: Often true    Ran Out of Food in the Last Year: Often true  Transportation Needs: No Transportation Needs (01/22/2023)   PRAPARE -  Administrator, Civil Service (Medical): No    Lack of Transportation (Non-Medical): No  Physical Activity: Sufficiently Active (01/22/2023)   Exercise Vital Sign    Days of Exercise per Week: 6 days    Minutes of Exercise per Session: 30 min  Stress: Stress Concern Present (01/22/2023)   [employer] of Occupational Health - Occupational Stress Questionnaire    Feeling of Stress : To some extent  Social Connections: Moderately Isolated (01/22/2023)   Social Connection and Isolation Panel [NHANES]    Frequency of Communication with Friends and Family: Twice a week    Frequency of Social Gatherings with Friends and Family: Once a week    Attends Religious Services: 1 to 4 times per year    Active Member of [employer] or Organizations: No    Attends Banker Meetings: Never    Marital Status: Never married  Intimate Partner Violence: Not At Risk (01/22/2023)   Humiliation, Afraid, Rape, and Kick questionnaire    Fear of Current or Ex-Partner: No    Emotionally Abused: No    Physically Abused: No    Sexually Abused: No    Review of Systems  All other systems reviewed and are negative.       Objective    BP (!) 185/102 (BP Location: Right Arm, Patient Position: Sitting, Cuff Size: Large)   Pulse 79   Temp 98.1 F (36.7 C) (Oral)   Resp 18   Ht 5\' 3"  (1.6 m)   Wt 174 lb 12.8 oz (79.3 kg)   SpO2 97%   BMI 30.96 kg/m   Physical Exam Vitals and nursing note reviewed.  Constitutional:      General: She is not in acute distress. Cardiovascular:     Rate and Rhythm: Normal rate and regular rhythm.  Pulmonary:     Effort: Pulmonary effort is normal.     Breath sounds: Normal breath sounds.  Abdominal:     Palpations: Abdomen is soft.     Tenderness: There is no abdominal tenderness.  Neurological:     General: No focal deficit present.     Mental Status: She is alert and oriented to person, place, and time.         Assessment & Plan:  1.  Uncontrolled hypertension Elevated readings. Will increase the hyzaar and add amlodipine 10 mg to regimen. Discussed compliance.   2. Encounter for immunization  - Flu vaccine trivalent PF, 6mos and older(Flulaval,Afluria,Fluarix,Fluzone)  Return in about 2 weeks (around 03/08/2023) for physical.   Tommie Raymond, MD

## 2023-03-21 ENCOUNTER — Ambulatory Visit: Payer: 59 | Admitting: Family Medicine

## 2023-03-21 ENCOUNTER — Encounter: Payer: Self-pay | Admitting: Family Medicine

## 2023-03-21 VITALS — BP 124/82 | HR 94 | Temp 98.3°F | Resp 16 | Wt 169.0 lb

## 2023-03-21 DIAGNOSIS — Z0000 Encounter for general adult medical examination without abnormal findings: Secondary | ICD-10-CM

## 2023-03-21 DIAGNOSIS — Z13220 Encounter for screening for lipoid disorders: Secondary | ICD-10-CM

## 2023-03-21 DIAGNOSIS — Z130 Encounter for screening for diseases of the blood and blood-forming organs and certain disorders involving the immune mechanism: Secondary | ICD-10-CM | POA: Diagnosis not present

## 2023-03-21 DIAGNOSIS — Z1329 Encounter for screening for other suspected endocrine disorder: Secondary | ICD-10-CM

## 2023-03-21 DIAGNOSIS — Z13228 Encounter for screening for other metabolic disorders: Secondary | ICD-10-CM

## 2023-03-21 DIAGNOSIS — Z1159 Encounter for screening for other viral diseases: Secondary | ICD-10-CM

## 2023-03-21 DIAGNOSIS — Z1231 Encounter for screening mammogram for malignant neoplasm of breast: Secondary | ICD-10-CM

## 2023-03-21 NOTE — Progress Notes (Signed)
Established Patient Office Visit  Subjective    Patient ID: Taylor Booth, female    DOB: [birth year]  Age: 47 y.o. MRN: [id number]  CC:  Chief Complaint  Patient presents with   Annual Exam    HPI Taylor Booth presents for routine annual exam. Patient denies acute complaints.   Outpatient Encounter Medications as of 03/21/2023  Medication Sig   amLODipine (NORVASC) 10 MG tablet Take 1 tablet (10 mg total) by mouth daily.   BLACK COHOSH PO Take by mouth.   buPROPion (WELLBUTRIN XL) 300 MG 24 hr tablet Take 1 tablet (300 mg total) by mouth daily.   cholecalciferol (VITAMIN D3) 25 MCG (1000 UNIT) tablet Take 1 tablet (1,000 Units total) by mouth daily.   losartan-hydrochlorothiazide (HYZAAR) 100-25 MG tablet Take 1 tablet by mouth daily.   Multiple Vitamins-Minerals (MULTIVITAMIN WITH MINERALS) tablet Take 1 tablet by mouth daily.   [DISCONTINUED] atorvastatin (LIPITOR) 40 MG tablet TAKE 1 TABLET(40 MG) BY MOUTH DAILY   [DISCONTINUED] calcium gluconate 500 MG tablet Take 1.5 tablets (750 mg total) by mouth 2 (two) times daily.   [DISCONTINUED] ferrous gluconate (FERGON) 324 MG tablet Take 1 tablet (324 mg total) by mouth 2 (two) times daily with a meal.   [DISCONTINUED] lamoTRIgine (LAMICTAL) 25 MG tablet Take 25 mg by mouth daily. Take 1 tablet daily for 2 weeks, then increase to 1 twice daily.   No facility-administered encounter medications on file as of 03/21/2023.    Past Medical History:  Diagnosis Date   Allergy    Anemia 2020   iron deficiency, eval Dr. Thornton Papas    Anxiety    Chronic kidney disease 2020   nephrotic syndrome, hypoalbuminemia .  Dr. Sabra Heck, [facility]   Depression 10/2017   Hyperlipidemia 10/2017   Hypertension 10/2017   Thyroid nodule    2020, recommended biopsy, but declined at the time   Wears glasses     Past Surgical History:  Procedure Laterality Date   BUNIONECTOMY     right   CESAREAN SECTION     x3, 2003, 1997, 1991    RENAL BIOPSY  2020    Family History  Problem Relation Age of Onset   Diabetes Mother    Cancer Father        liver   Diabetes Maternal Grandmother     Social History   Socioeconomic History   Marital status: Single    Spouse name: Not on file   Number of children: Not on file   Years of education: Not on file   Highest education level: Not on file  Occupational History   Not on file  Tobacco Use   Smoking status: Former    Current packs/day: 0.00    Average packs/day: 0.5 packs/day for 11.0 years (5.5 ttl pk-yrs)    Types: Cigarettes    Start date: 07/03/2006    Quit date: 07/03/2017    Years since quitting: 5.7   Smokeless tobacco: Never   Tobacco comments:    quit 48 days ago  Vaping Use   Vaping status: Never Used  Substance and Sexual Activity   Alcohol use: Yes    Alcohol/week: 2.0 standard drinks of alcohol    Types: 2 Glasses of wine per week   Drug use: Never   Sexual activity: Yes  Other Topics Concern   Not on file  Social History Narrative   Lives with daughter, currently unemployed, looking for work.  Exercise - walk. 06/2019.  Social Determinants of Health   Financial Resource Strain: High Risk (01/22/2023)   Overall Financial Resource Strain (CARDIA)    Difficulty of Paying Living Expenses: Hard  Food Insecurity: Food Insecurity Present (01/22/2023)   Hunger Vital Sign    Worried About Running Out of Food in the Last Year: Often true    Ran Out of Food in the Last Year: Often true  Transportation Needs: No Transportation Needs (01/22/2023)   PRAPARE - Administrator, Civil Service (Medical): No    Lack of Transportation (Non-Medical): No  Physical Activity: Sufficiently Active (01/22/2023)   Exercise Vital Sign    Days of Exercise per Week: 6 days    Minutes of Exercise per Session: 30 min  Stress: Stress Concern Present (01/22/2023)   [employer] of Occupational Health - Occupational Stress Questionnaire    Feeling of Stress : To  some extent  Social Connections: Moderately Isolated (01/22/2023)   Social Connection and Isolation Panel [NHANES]    Frequency of Communication with Friends and Family: Twice a week    Frequency of Social Gatherings with Friends and Family: Once a week    Attends Religious Services: 1 to 4 times per year    Active Member of [employer] or Organizations: No    Attends Banker Meetings: Never    Marital Status: Never married  Intimate Partner Violence: Not At Risk (01/22/2023)   Humiliation, Afraid, Rape, and Kick questionnaire    Fear of Current or Ex-Partner: No    Emotionally Abused: No    Physically Abused: No    Sexually Abused: No    Review of Systems  All other systems reviewed and are negative.       Objective    BP 124/82   Pulse 94   Temp 98.3 F (36.8 C) (Oral)   Resp 16   Wt 169 lb (76.7 kg)   SpO2 97%   BMI 29.94 kg/m   Physical Exam Vitals and nursing note reviewed.  Constitutional:      General: She is not in acute distress. HENT:     Head: Normocephalic and atraumatic.     Right Ear: Tympanic membrane, ear canal and external ear normal.     Left Ear: Tympanic membrane, ear canal and external ear normal.     Nose: Nose normal.     Mouth/Throat:     Mouth: Mucous membranes are moist.     Pharynx: Oropharynx is clear.  Eyes:     Conjunctiva/sclera: Conjunctivae normal.     Pupils: Pupils are equal, round, and reactive to light.  Neck:     Thyroid: No thyromegaly.  Cardiovascular:     Rate and Rhythm: Normal rate and regular rhythm.     Heart sounds: Normal heart sounds. No murmur heard. Pulmonary:     Effort: Pulmonary effort is normal. No respiratory distress.     Breath sounds: Normal breath sounds.  Abdominal:     General: There is no distension.     Palpations: Abdomen is soft. There is no mass.     Tenderness: There is no abdominal tenderness.  Musculoskeletal:        General: Normal range of motion.     Cervical back: Normal range  of motion and neck supple.  Skin:    General: Skin is warm and dry.  Neurological:     General: No focal deficit present.     Mental Status: She is alert and oriented to person, place,  and time.  Psychiatric:        Mood and Affect: Mood normal.        Behavior: Behavior normal.         Assessment & Plan:   Annual physical exam -     CMP14+EGFR  Screening for deficiency anemia -     CBC with Differential/Platelet  Screening for lipid disorders  Screening for endocrine/metabolic/immunity disorders -     Hemoglobin A1c -     VITAMIN D 25 Hydroxy (Vit-D Deficiency, Fractures)  Need for hepatitis C screening test -     Hepatitis C antibody  Encounter for screening mammogram for malignant neoplasm of breast -     3D Screening Mammogram, Left and Right; Future     Return in about 6 months (around 09/18/2023) for follow up.   Tommie Raymond, MD

## 2023-03-22 LAB — CBC WITH DIFFERENTIAL/PLATELET
Basophils Absolute: 0 10*3/uL (ref 0.0–0.2)
Basos: 1 %
EOS (ABSOLUTE): 0.1 10*3/uL (ref 0.0–0.4)
Eos: 2 %
Hematocrit: 42 % (ref 34.0–46.6)
Hemoglobin: 13.4 g/dL (ref 11.1–15.9)
Immature Grans (Abs): 0 10*3/uL (ref 0.0–0.1)
Immature Granulocytes: 0 %
Lymphocytes Absolute: 2.4 10*3/uL (ref 0.7–3.1)
Lymphs: 43 %
MCH: 28.1 pg (ref 26.6–33.0)
MCHC: 31.9 g/dL (ref 31.5–35.7)
MCV: 88 fL (ref 79–97)
Monocytes Absolute: 0.5 10*3/uL (ref 0.1–0.9)
Monocytes: 9 %
Neutrophils Absolute: 2.5 10*3/uL (ref 1.4–7.0)
Neutrophils: 45 %
Platelets: 406 10*3/uL (ref 150–450)
RBC: 4.77 x10E6/uL (ref 3.77–5.28)
RDW: 13.3 % (ref 11.7–15.4)
WBC: 5.6 10*3/uL (ref 3.4–10.8)

## 2023-03-22 LAB — CMP14+EGFR
ALT: 18 [IU]/L (ref 0–32)
AST: 18 [IU]/L (ref 0–40)
Albumin: 4.5 g/dL (ref 3.9–4.9)
Alkaline Phosphatase: 143 [IU]/L — ABNORMAL HIGH (ref 44–121)
BUN/Creatinine Ratio: 13 (ref 9–23)
BUN: 15 mg/dL (ref 6–24)
Bilirubin Total: 0.2 mg/dL (ref 0.0–1.2)
CO2: 23 mmol/L (ref 20–29)
Calcium: 10.1 mg/dL (ref 8.7–10.2)
Chloride: 102 mmol/L (ref 96–106)
Creatinine, Ser: 1.15 mg/dL — ABNORMAL HIGH (ref 0.57–1.00)
Globulin, Total: 2.9 g/dL (ref 1.5–4.5)
Glucose: 100 mg/dL — ABNORMAL HIGH (ref 70–99)
Potassium: 3.9 mmol/L (ref 3.5–5.2)
Sodium: 140 mmol/L (ref 134–144)
Total Protein: 7.4 g/dL (ref 6.0–8.5)
eGFR: 58 mL/min/{1.73_m2} — ABNORMAL LOW (ref >59)

## 2023-03-22 LAB — HEMOGLOBIN A1C
Est. average glucose Bld gHb Est-mCnc: 128 mg/dL
Hgb A1c MFr Bld: 6.1 % — ABNORMAL HIGH (ref 4.8–5.6)

## 2023-03-22 LAB — VITAMIN D 25 HYDROXY (VIT D DEFICIENCY, FRACTURES): Vit D, 25-Hydroxy: 34.1 ng/mL (ref 30.0–100.0)

## 2023-03-22 LAB — HEPATITIS C ANTIBODY: Hep C Virus Ab: NONREACTIVE

## 2023-05-19 ENCOUNTER — Other Ambulatory Visit: Payer: Self-pay | Admitting: Family Medicine

## 2023-07-25 ENCOUNTER — Other Ambulatory Visit: Payer: Self-pay | Admitting: Family Medicine

## 2023-08-29 ENCOUNTER — Other Ambulatory Visit: Payer: Self-pay | Admitting: Family Medicine

## 2023-09-18 ENCOUNTER — Ambulatory Visit: Payer: 59 | Admitting: Family Medicine

## 2023-11-01 ENCOUNTER — Encounter: Payer: Self-pay | Admitting: Family Medicine

## 2023-11-01 ENCOUNTER — Ambulatory Visit (INDEPENDENT_AMBULATORY_CARE_PROVIDER_SITE_OTHER): Admitting: Family Medicine

## 2023-11-01 VITALS — BP 146/85 | HR 97 | Wt 153.4 lb

## 2023-11-01 DIAGNOSIS — F32A Depression, unspecified: Secondary | ICD-10-CM | POA: Diagnosis not present

## 2023-11-01 DIAGNOSIS — F411 Generalized anxiety disorder: Secondary | ICD-10-CM

## 2023-11-01 DIAGNOSIS — I10 Essential (primary) hypertension: Secondary | ICD-10-CM | POA: Diagnosis not present

## 2023-11-01 DIAGNOSIS — E785 Hyperlipidemia, unspecified: Secondary | ICD-10-CM

## 2023-11-01 MED ORDER — AMLODIPINE BESYLATE 10 MG PO TABS: 10.0000 mg | ORAL_TABLET | Freq: Every day | ORAL | 1 refills | Status: AC

## 2023-11-01 MED ORDER — BREXPIPRAZOLE 1 MG PO TABS: 1.0000 mg | ORAL_TABLET | Freq: Every day | ORAL | 0 refills | Status: AC

## 2023-11-01 MED ORDER — HYDROXYZINE PAMOATE 25 MG PO CAPS: 25.0000 mg | ORAL_CAPSULE | Freq: Three times a day (TID) | ORAL | 0 refills | Status: DC | PRN

## 2023-11-01 MED ORDER — LOSARTAN POTASSIUM-HCTZ 100-25 MG PO TABS: 1.0000 | ORAL_TABLET | Freq: Every day | ORAL | 1 refills | Status: AC

## 2023-11-01 NOTE — Progress Notes (Unsigned)
 Established Patient Office Visit  Subjective    Patient ID: Taylor Booth, female    DOB: [birth year]  Age: 48 y.o. MRN: [id number]  CC:  Chief Complaint  Patient presents with   Medical Management of Chronic Issues    Patient wanting a allergie pill    Anxiety    Medication not working     Medication Refill    HPI Taylor Booth presents for routine follow up of chronic med issues including hypertension and depression/anxiety. Patient reports that she does not believe that the meds for her mood are enough.   Outpatient Encounter Medications as of 11/01/2023  Medication Sig   BLACK COHOSH PO Take by mouth.   brexpiprazole (REXULTI) 1 MG TABS tablet Take 1 tablet (1 mg total) by mouth daily.   buPROPion  (WELLBUTRIN  XL) 300 MG 24 hr tablet TAKE 1 TABLET BY MOUTH EVERY DAY (Patient taking differently: Take 300 mg by mouth daily. Taking 1 & 1/2 tablets)   cholecalciferol (VITAMIN D3) 25 MCG (1000 UNIT) tablet Take 1 tablet (1,000 Units total) by mouth daily.   hydrOXYzine (VISTARIL) 25 MG capsule Take 1 capsule (25 mg total) by mouth every 8 (eight) hours as needed.   Multiple Vitamins-Minerals (MULTIVITAMIN WITH MINERALS) tablet Take 1 tablet by mouth daily.   [DISCONTINUED] losartan -hydrochlorothiazide  (HYZAAR) 100-25 MG tablet TAKE 1 TABLET BY MOUTH EVERY DAY   amLODipine  (NORVASC ) 10 MG tablet Take 1 tablet (10 mg total) by mouth daily.   losartan -hydrochlorothiazide  (HYZAAR) 100-25 MG tablet Take 1 tablet by mouth daily.   [DISCONTINUED] amLODipine  (NORVASC ) 10 MG tablet TAKE 1 TABLET BY MOUTH EVERY DAY (Patient not taking: Reported on 11/01/2023)   No facility-administered encounter medications on file as of 11/01/2023.    Past Medical History:  Diagnosis Date   Allergy    Anemia 2020   iron deficiency, eval Dr. Coni Deep    Anxiety    Chronic kidney disease 2020   nephrotic syndrome, hypoalbuminemia .  Dr. Fawn Hooks, [facility]   Depression 10/2017    Hyperlipidemia 10/2017   Hypertension 10/2017   Thyroid  nodule    2020, recommended biopsy, but declined at the time   Wears glasses     Past Surgical History:  Procedure Laterality Date   BUNIONECTOMY     right   CESAREAN SECTION     x3, 2003, 1997, 1991   RENAL BIOPSY  2020    Family History  Problem Relation Age of Onset   Diabetes Mother    Cancer Father        liver   Diabetes Maternal Grandmother     Social History   Socioeconomic History   Marital status: Single    Spouse name: Not on file   Number of children: Not on file   Years of education: Not on file   Highest education level: Not on file  Occupational History   Not on file  Tobacco Use   Smoking status: Former    Current packs/day: 0.00    Average packs/day: 0.5 packs/day for 11.0 years (5.5 ttl pk-yrs)    Types: Cigarettes    Start date: 07/03/2006    Quit date: 07/03/2017    Years since quitting: 6.3   Smokeless tobacco: Never   Tobacco comments:    quit 48 days ago  Vaping Use   Vaping status: Never Used  Substance and Sexual Activity   Alcohol use: Yes    Alcohol/week: 2.0 standard drinks of alcohol  Types: 2 Glasses of wine per week   Drug use: Never   Sexual activity: Yes  Other Topics Concern   Not on file  Social History Narrative   Lives with daughter, currently unemployed, looking for work.  Exercise - walk. 06/2019.   Social Drivers of Health   Financial Resource Strain: High Risk (01/22/2023)   Overall Financial Resource Strain (CARDIA)    Difficulty of Paying Living Expenses: Hard  Food Insecurity: Food Insecurity Present (01/22/2023)   Hunger Vital Sign    Worried About Running Out of Food in the Last Year: Often true    Ran Out of Food in the Last Year: Often true  Transportation Needs: No Transportation Needs (01/22/2023)   PRAPARE - Administrator, Civil Service (Medical): No    Lack of Transportation (Non-Medical): No  Physical Activity: Sufficiently Active  (01/22/2023)   Exercise Vital Sign    Days of Exercise per Week: 6 days    Minutes of Exercise per Session: 30 min  Stress: Stress Concern Present (01/22/2023)   [employer] of Occupational Health - Occupational Stress Questionnaire    Feeling of Stress : To some extent  Social Connections: Moderately Isolated (01/22/2023)   Social Connection and Isolation Panel    Frequency of Communication with Friends and Family: Twice a week    Frequency of Social Gatherings with Friends and Family: Once a week    Attends Religious Services: 1 to 4 times per year    Active Member of [employer] or Organizations: No    Attends Banker Meetings: Never    Marital Status: Never married  Intimate Partner Violence: Not At Risk (01/22/2023)   Humiliation, Afraid, Rape, and Kick questionnaire    Fear of Current or Ex-Partner: No    Emotionally Abused: No    Physically Abused: No    Sexually Abused: No    Review of Systems  All other systems reviewed and are negative.       Objective    BP (!) 146/85 (BP Location: Right Arm, Patient Position: Sitting, Cuff Size: Normal)   Pulse 97   Wt 153 lb 6.4 oz (69.6 kg)   SpO2 98%   BMI 27.17 kg/m   Physical Exam Vitals and nursing note reviewed.  Constitutional:      General: She is not in acute distress.  Cardiovascular:     Rate and Rhythm: Normal rate and regular rhythm.  Pulmonary:     Effort: Pulmonary effort is normal.     Breath sounds: Normal breath sounds.  Abdominal:     Palpations: Abdomen is soft.     Tenderness: There is no abdominal tenderness.   Neurological:     General: No focal deficit present.     Mental Status: She is alert and oriented to person, place, and time.         Assessment & Plan:   1. Essential hypertension (Primary) Slightl elevated readings. Meds refilled. Compliance discussed.   2. Depression, unspecified depression type Will add rexulti 1 mg daily to regimen.   3. Anxiety  state Hydroxyzine prescribed for prn usage  4. Hyperlipidemia, unspecified hyperlipidemia type Continue   Return in about 3 weeks (around 11/22/2023) for follow up.   Arlo Lama, MD

## 2023-11-02 ENCOUNTER — Encounter: Payer: Self-pay | Admitting: Family Medicine

## 2023-12-24 ENCOUNTER — Other Ambulatory Visit: Payer: Self-pay | Admitting: Family Medicine

## 2024-01-27 ENCOUNTER — Other Ambulatory Visit: Payer: Self-pay | Admitting: Family Medicine

## 2024-02-01 ENCOUNTER — Encounter: Payer: Self-pay | Admitting: Family Medicine

## 2024-02-01 ENCOUNTER — Ambulatory Visit (INDEPENDENT_AMBULATORY_CARE_PROVIDER_SITE_OTHER): Admitting: Family Medicine

## 2024-02-01 ENCOUNTER — Other Ambulatory Visit: Payer: Self-pay | Admitting: Family Medicine

## 2024-02-01 VITALS — BP 154/89 | HR 83 | Ht 63.0 in | Wt 150.2 lb

## 2024-02-01 DIAGNOSIS — I10 Essential (primary) hypertension: Secondary | ICD-10-CM | POA: Diagnosis not present

## 2024-02-01 DIAGNOSIS — F411 Generalized anxiety disorder: Secondary | ICD-10-CM

## 2024-02-01 DIAGNOSIS — F32A Depression, unspecified: Secondary | ICD-10-CM | POA: Diagnosis not present

## 2024-02-01 MED ORDER — BUPROPION HCL ER (XL) 150 MG PO TB24: 450.0000 mg | ORAL_TABLET | Freq: Every day | ORAL | 2 refills | Status: AC

## 2024-02-01 NOTE — Progress Notes (Signed)
 Established Patient Office Visit  Subjective    Patient ID: Taylor Booth, female    DOB: April 10, 1976  Age: 48 y.o. MRN: [id number]  CC:  Chief Complaint  Patient presents with   Medical Management of Chronic Issues    HPI Taylor Booth presents for follow up of anxiety/depression. She is currently seeing a therapist. Patient would like to increase her med dosage. She has increased social stressors.  Outpatient Encounter Medications as of 02/01/2024  Medication Sig   amLODipine  (NORVASC ) 10 MG tablet Take 1 tablet (10 mg total) by mouth daily.   buPROPion  (WELLBUTRIN  XL) 150 MG 24 hr tablet Take 3 tablets (450 mg total) by mouth daily.   buPROPion  (WELLBUTRIN  XL) 300 MG 24 hr tablet TAKE 1 TABLET BY MOUTH EVERY DAY   cholecalciferol (VITAMIN D3) 25 MCG (1000 UNIT) tablet Take 1 tablet (1,000 Units total) by mouth daily.   hydrOXYzine  (VISTARIL ) 25 MG capsule TAKE 1 CAPSULE (25 MG TOTAL) BY MOUTH EVERY 8 (EIGHT) HOURS AS NEEDED.   losartan -hydrochlorothiazide  (HYZAAR) 100-25 MG tablet Take 1 tablet by mouth daily.   Multiple Vitamins-Minerals (MULTIVITAMIN WITH MINERALS) tablet Take 1 tablet by mouth daily.   BLACK COHOSH PO Take by mouth.   brexpiprazole  (REXULTI ) 1 MG TABS tablet Take 1 tablet (1 mg total) by mouth daily.   No facility-administered encounter medications on file as of 02/01/2024.    Past Medical History:  Diagnosis Date   Allergy    Anemia 2020   iron deficiency, eval Dr. Arley Hof    Anxiety    Chronic kidney disease 2020   nephrotic syndrome, hypoalbuminemia .  Dr. Bernardino Gasman, [facility]   Depression 10/2017   Hyperlipidemia 10/2017   Hypertension 10/2017   Thyroid  nodule    2020, recommended biopsy, but declined at the time   Wears glasses     Past Surgical History:  Procedure Laterality Date   BUNIONECTOMY     right   CESAREAN SECTION     x3, 2003, 1997, 1991   RENAL BIOPSY  2020    Family History  Problem Relation Age of Onset    Diabetes Mother    Cancer Father        liver   Diabetes Maternal Grandmother     Social History   Socioeconomic History   Marital status: Single    Spouse name: Not on file   Number of children: Not on file   Years of education: Not on file   Highest education level: Not on file  Occupational History   Not on file  Tobacco Use   Smoking status: Former    Current packs/day: 0.00    Average packs/day: 0.5 packs/day for 11.0 years (5.5 ttl pk-yrs)    Types: Cigarettes    Start date: 07/03/2006    Quit date: 07/03/2017    Years since quitting: 6.5   Smokeless tobacco: Never   Tobacco comments:    quit 48 days ago  Vaping Use   Vaping status: Never Used  Substance and Sexual Activity   Alcohol use: Yes    Alcohol/week: 2.0 standard drinks of alcohol    Types: 2 Glasses of wine per week   Drug use: Never   Sexual activity: Yes  Other Topics Concern   Not on file  Social History Narrative   Lives with daughter, currently unemployed, looking for work.  Exercise - walk. 06/2019.   Social Drivers of Health   Financial Resource Strain: High Risk (01/22/2023)  Overall Financial Resource Strain (CARDIA)    Difficulty of Paying Living Expenses: Hard  Food Insecurity: Food Insecurity Present (01/22/2023)   Hunger Vital Sign    Worried About Running Out of Food in the Last Year: Often true    Ran Out of Food in the Last Year: Often true  Transportation Needs: No Transportation Needs (01/22/2023)   PRAPARE - Administrator, Civil Service (Medical): No    Lack of Transportation (Non-Medical): No  Physical Activity: Sufficiently Active (01/22/2023)   Exercise Vital Sign    Days of Exercise per Week: 6 days    Minutes of Exercise per Session: 30 min  Stress: Stress Concern Present (01/22/2023)   [employer] of Occupational Health - Occupational Stress Questionnaire    Feeling of Stress : To some extent  Social Connections: Moderately Isolated (01/22/2023)   Social  Connection and Isolation Panel    Frequency of Communication with Friends and Family: Twice a week    Frequency of Social Gatherings with Friends and Family: Once a week    Attends Religious Services: 1 to 4 times per year    Active Member of [employer] or Organizations: No    Attends Banker Meetings: Never    Marital Status: Never married  Intimate Partner Violence: Not At Risk (01/22/2023)   Humiliation, Afraid, Rape, and Kick questionnaire    Fear of Current or Ex-Partner: No    Emotionally Abused: No    Physically Abused: No    Sexually Abused: No    Review of Systems  Psychiatric/Behavioral:  Positive for depression. Negative for suicidal ideas. The patient is nervous/anxious.   All other systems reviewed and are negative.       Objective    BP (!) 154/89   Pulse 83   Ht 5' 3 (1.6 m)   Wt 150 lb 3.2 oz (68.1 kg)   LMP 10/14/2022   SpO2 97%   BMI 26.61 kg/m   Physical Exam Vitals and nursing note reviewed.  Constitutional:      General: She is not in acute distress. Cardiovascular:     Rate and Rhythm: Normal rate and regular rhythm.  Pulmonary:     Effort: Pulmonary effort is normal.     Breath sounds: Normal breath sounds.  Abdominal:     Palpations: Abdomen is soft.     Tenderness: There is no abdominal tenderness.  Neurological:     General: No focal deficit present.     Mental Status: She is alert and oriented to person, place, and time.  Psychiatric:        Mood and Affect: Affect normal. Mood is anxious.         Assessment & Plan:   Anxiety state  Depression, unspecified depression type  Essential hypertension  Other orders -     buPROPion  HCl ER (XL); Take 3 tablets (450 mg total) by mouth daily.  Dispense: 90 tablet; Refill: 2     Return in about 3 months (around 05/02/2024) for follow up.   Tanda Raguel SQUIBB, MD

## 2024-02-13 ENCOUNTER — Ambulatory Visit
Admission: EM | Admit: 2024-02-13 | Discharge: 2024-02-13 | Disposition: A | Discharge status: Home / Self Care | Attending: Nurse Practitioner | Admitting: Nurse Practitioner

## 2024-02-13 DIAGNOSIS — H1131 Conjunctival hemorrhage, right eye: Secondary | ICD-10-CM | POA: Diagnosis not present

## 2024-02-13 MED ORDER — NAPHAZOLINE-PHENIRAMINE 0.025-0.3 % OP SOLN: 1.0000 [drp] | OPHTHALMIC | 0 refills | Status: AC | PRN

## 2024-02-13 NOTE — ED Provider Notes (Signed)
 [facility]    CSN: [id number] Arrival date & time: 02/13/24  9162      History   Chief Complaint Chief Complaint  Patient presents with   Eye Problem    HPI Taylor Booth is a 48 y.o. female.   Discussed the use of AI scribe software for clinical note transcription with the patient, who gave verbal consent to proceed.   The patient presents with redness, swelling, and watering of the right eye. She first noticed irritation at the outer corner of the eye yesterday, followed by redness that became more apparent upon waking this morning. The redness is most prominent in the corner of the right eye. She also reports occasional clear watering of the eye. She denies pain, itching, or visual disturbances. She does not wear contact lenses but does wear glasses. There has been no trauma to the eye.  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 She recently started working in a new client's home approximately two weeks ago, which includes exposure to animals. She reports recent physical activity while playing basketball with her son but denies lifting anything heavy. She acknowledges possible sneezing episodes given her history of allergies but denies significant nasal congestion, runny nose, or other allergy symptoms. She used an eye ointment previously prescribed for conjunctivitis prior to presentation.  The following sections of the patient's history were reviewed and updated as appropriate: allergies, current medications, past family history, past medical history, past social history, past surgical history, and problem list.     Past Medical History:  Diagnosis Date   Allergy    Anemia 2020   iron deficiency, eval Dr. Arley Hof    Anxiety    Chronic kidney disease 2020   nephrotic syndrome, hypoalbuminemia .  Dr. Bernardino Gasman, [facility]   Depression 10/2017    Hyperlipidemia 10/2017   Hypertension 10/2017   Thyroid  nodule    2020, recommended biopsy, but declined at the time   Wears glasses     Patient Active Problem List   Diagnosis Date Noted   Depression, major, single episode, mild 07/07/2019   Smoker 07/07/2019   Dermatitis 07/24/2018   Nephrotic syndrome 07/24/2018   Hypoalbuminemia 07/04/2018   Thyroid  mass 07/04/2018   Abnormal thyroid  blood test 07/04/2018   Proteinuria 07/04/2018   Encounter for health maintenance examination in adult 07/03/2018   Essential hypertension 07/03/2018   Edema 07/03/2018   Murmur 07/03/2018   Anemia 07/03/2018   Vaccine counseling 07/03/2018    Past Surgical History:  Procedure Laterality Date   BUNIONECTOMY     right   CESAREAN SECTION     x3, 2003, 1997, 1991   RENAL BIOPSY  2020    OB History   No obstetric history on file.      Home Medications    Prior to Admission medications   Medication Sig Start Date End Date Taking? Authorizing Provider  amLODipine  (NORVASC ) 10 MG tablet Take 1 tablet (10 mg total) by mouth daily. 11/01/23  Yes Tanda Bleacher, MD  buPROPion  (WELLBUTRIN  XL) 300 MG 24 hr tablet TAKE 1 TABLET BY MOUTH EVERY DAY 02/08/24  Yes Tanda Bleacher, MD  hydrOXYzine  (VISTARIL ) 25 MG capsule TAKE 1 CAPSULE (25 MG TOTAL) BY MOUTH EVERY 8 (EIGHT) HOURS AS NEEDED. 02/08/24  Yes Tanda Bleacher, MD  losartan -hydrochlorothiazide  (HYZAAR) 100-25 MG tablet Take 1 tablet by mouth daily. 11/01/23  Yes Tanda Bleacher, MD  Multiple Vitamins-Minerals (MULTIVITAMIN WITH MINERALS) tablet Take 1 tablet by mouth daily. 07/08/19  Yes Tysinger, Alm RAMAN, PA-C  naphazoline-pheniramine (NAPHCON-A) 0.025-0.3 % ophthalmic solution Place 1 drop into the right eye every 4 (four) hours as needed for eye irritation. 02/13/24  Yes Iola Lukes, FNP  UNKNOWN TO PATIENT Rx eye oint. (Previous Rx).   Yes [provider]  BLACK COHOSH PO Take by mouth.    [provider]   brexpiprazole  (REXULTI ) 1 MG TABS tablet Take 1 tablet (1 mg total) by mouth daily. 11/01/23   Tanda Bleacher, MD  buPROPion  (WELLBUTRIN  XL) 150 MG 24 hr tablet Take 3 tablets (450 mg total) by mouth daily. 02/01/24   Tanda Bleacher, MD  cholecalciferol (VITAMIN D3) 25 MCG (1000 UNIT) tablet Take 1 tablet (1,000 Units total) by mouth daily. 07/08/19   Tysinger, Alm RAMAN, PA-C    Family History Family History  Problem Relation Age of Onset   Diabetes Mother    Cancer Father        liver   Diabetes Maternal Grandmother     Social History Social History   Tobacco Use   Smoking status: Former    Current packs/day: 0.00    Average packs/day: 0.5 packs/day for 11.0 years (5.5 ttl pk-yrs)    Types: Cigarettes    Start date: 07/03/2006    Quit date: 07/03/2017    Years since quitting: 6.6   Smokeless tobacco: Never   Tobacco comments:    quit 48 days ago  Vaping Use   Vaping status: Never Used  Substance Use Topics   Alcohol use: Yes    Alcohol/week: 2.0 standard drinks of alcohol    Types: 2 Glasses of wine per week    Comment: Occ   Drug use: Never     Allergies   Penicillins   Review of Systems Review of Systems  Constitutional: Negative.   HENT: Negative.    Eyes:  Positive for discharge and redness. Negative for pain.  Respiratory: Negative.    Gastrointestinal: Negative.   Genitourinary: Negative.   Skin: Negative.   All other systems reviewed and are negative.    Physical Exam Triage Vital Signs ED Triage Vitals  Encounter Vitals Group     BP 02/13/24 0848 116/78     Girls Systolic BP Percentile --      Girls Diastolic BP Percentile --      Boys Systolic BP Percentile --      Boys Diastolic BP Percentile --      Pulse Rate 02/13/24 0848 85     Resp 02/13/24 0848 18     Temp 02/13/24 0848 98.1 F (36.7 C)     Temp Source 02/13/24 0848 Oral     SpO2 02/13/24 0848 97 %     Weight 02/13/24 0845 151 lb (68.5 kg)     Height 02/13/24 0845 5' 3 (1.6 m)      Head Circumference --      Peak Flow --      Pain Score 02/13/24 0842 0     Pain Loc --      Pain Education --      Exclude from Growth Chart --    No data found.  Updated Vital Signs BP 116/78 (BP Location: Left Arm)   Pulse 85   Temp 98.1 F (36.7 C) (Oral)   Resp 18   Ht 5' 3 (1.6 m)   Wt 151 lb (68.5 kg)   LMP  (LMP  Unknown)   SpO2 97%   BMI 26.75 kg/m   Visual Acuity Right Eye Distance: (S) 20/30 (Corrected) Left Eye Distance: (S) 20/20 (Corrected) Bilateral Distance: (S) 20/20 (Corrected)  Right Eye Near:   Left Eye Near:    Bilateral Near:     Physical Exam Vitals reviewed.  Constitutional:      General: She is awake. She is not in acute distress.    Appearance: Normal appearance. She is well-developed. She is not ill-appearing, toxic-appearing or diaphoretic.  HENT:     Head: Normocephalic.     Right Ear: Hearing normal.     Left Ear: Hearing normal.     Nose: Nose normal.     Mouth/Throat:     Mouth: Mucous membranes are moist.  Eyes:     General: Lids are normal. Vision grossly intact. Gaze aligned appropriately. No visual field deficit.       Left eye: No foreign body, discharge or hordeolum.     Extraocular Movements: Extraocular movements intact.     Right eye: Normal extraocular motion and no nystagmus.     Left eye: Normal extraocular motion and no nystagmus.     Conjunctiva/sclera:     Right eye: Hemorrhage present.      Comments: Hemorrhage observed within the lateral aspect of the right eye. No periorbital erythema or edema present. Eyelids appear normal. Vision grossly intact. Pupils are equal, round, and reactive to light and accommodation  Cardiovascular:     Rate and Rhythm: Normal rate and regular rhythm.     Heart sounds: Normal heart sounds.  Pulmonary:     Effort: Pulmonary effort is normal.     Breath sounds: Normal breath sounds and air entry.  Musculoskeletal:        General: Normal range of motion.     Cervical back: Normal  range of motion and neck supple.  Skin:    General: Skin is warm and dry.  Neurological:     General: No focal deficit present.     Mental Status: She is alert and oriented to person, place, and time.  Psychiatric:        Speech: Speech normal.        Behavior: Behavior is cooperative.      UC Treatments / Results  Labs (all labs ordered are listed, but only abnormal results are displayed) Labs Reviewed - No data to display  EKG   Radiology No results found.  Procedures Procedures (including critical care time)  Medications Ordered in UC Medications - No data to display  Initial Impression / Assessment and Plan / UC Course  I have reviewed the triage vital signs and the nursing notes.  Pertinent labs & imaging results that were available during my care of the patient were reviewed by me and considered in my medical decision making (see chart for details).     Patient presents with eye irritation and mild watering without associated pain, visual disturbance, itching, or history of injury. Exam findings are consistent with subconjunctival hemorrhage. Condition discussed with patient, including benign and self-limited nature. Naphcon-A ophthalmic drops prescribed as needed for irritation, and patient advised to use cool compresses to the affected eye. Reassurance provided that hemorrhage will gradually reabsorb over time. Emergency precautions reviewed, including return for worsening pain, visual changes, or other concerning symptoms.  Today's evaluation has revealed no signs of a dangerous process. Discussed diagnosis with patient and/or guardian. Patient and/or guardian aware of their diagnosis, possible red flag symptoms to  watch out for and need for close follow up. Patient and/or guardian understands verbal and written discharge instructions. Patient and/or guardian comfortable with plan and disposition.  Patient and/or guardian has a clear mental status at this time, good  insight into illness (after discussion and teaching) and has clear judgment to make decisions regarding their care  Documentation was completed with the aid of voice recognition software. Transcription may contain typographical errors.  Final Clinical Impressions(s) / UC Diagnoses   Final diagnoses:  Subconjunctival hemorrhage, non-traumatic, right     Discharge Instructions      Today you were seen for eye redness, irritation and watering. Your exam showed a small broken blood vessel on the white part of your eye, called a subconjunctival hemorrhage. This condition looks like a red spot on the eye but is not dangerous and will heal on its own. You may use Naphcon-A eye drops as needed for irritation and apply a cool compress over the eye if it feels uncomfortable. The redness may look worse before it improves, but it will slowly fade as the blood reabsorbs over the next one to two weeks.  Seek emergency care right away if you develop sudden or severe eye pain, any changes in your vision such as blurry vision, loss of vision, new floaters or flashes, swelling around the eye, difficulty moving the eye, or any other concerning symptoms. Follow up with your primary care provider or an eye specialist if you have questions or if the problem does not improve.      ED Prescriptions     Medication Sig Dispense Auth. Provider   naphazoline-pheniramine (NAPHCON-A) 0.025-0.3 % ophthalmic solution Place 1 drop into the right eye every 4 (four) hours as needed for eye irritation. 15 mL Iola Lukes, FNP      PDMP not reviewed this encounter.   Iola Lukes, [place] 02/13/24 1012

## 2024-02-13 NOTE — ED Triage Notes (Addendum)
 Patient reports right eye problem, this happened before and was probably conjunctivitis. 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 No injury. This started yesterday with some discharge, but woke up this morning with redness, swelling. Current medications/treatment: Ointment used from previous CJ/Eye infection.

## 2024-02-13 NOTE — Discharge Instructions (Addendum)
 Today you were seen for eye redness, irritation and watering. Your exam showed a small broken blood vessel on the white part of your eye, called a subconjunctival hemorrhage. This condition looks like a red spot on the eye but is not dangerous and will heal on its own. You may use Naphcon-A eye drops as needed for irritation and apply a cool compress over the eye if it feels uncomfortable. The redness may look worse before it improves, but it will slowly fade as the blood reabsorbs over the next one to two weeks.  Seek emergency care right away if you develop sudden or severe eye pain, any changes in your vision such as blurry vision, loss of vision, new floaters or flashes, swelling around the eye, difficulty moving the eye, or any other concerning symptoms. Follow up with your primary care provider or an eye specialist if you have questions or if the problem does not improve.

## 2024-02-25 ENCOUNTER — Other Ambulatory Visit: Payer: Self-pay | Admitting: Family Medicine

## 2024-03-20 ENCOUNTER — Other Ambulatory Visit: Payer: Self-pay | Admitting: Family Medicine

## 2024-04-09 ENCOUNTER — Other Ambulatory Visit: Payer: Self-pay | Admitting: Family Medicine

## 2024-04-29 ENCOUNTER — Other Ambulatory Visit: Payer: Self-pay | Admitting: Family Medicine

## 2024-05-02 ENCOUNTER — Ambulatory Visit: Admitting: Family Medicine

## 2024-05-24 ENCOUNTER — Other Ambulatory Visit: Payer: Self-pay | Admitting: Family Medicine

## 2024-05-26 NOTE — Telephone Encounter (Signed)
 Requested medications are due for refill today.  yes  Requested medications are on the active medications list.  yes  Last refill. Hydroxyzine  04/29/2024 #60 0 rf Bupropion  300 02/08/2024 #90 0 rf  Future visit scheduled.   no  Notes to clinic.  Expired labs. Pt has both 300 and 150 mg of bupropion  on med list.    Requested Prescriptions  Pending Prescriptions Disp Refills   buPROPion  (WELLBUTRIN  XL) 300 MG 24 hr tablet [Pharmacy Med Name: BUPROPION  HCL XL 300 MG TABLET] 90 tablet 0    Sig: TAKE 1 TABLET BY MOUTH EVERY DAY     Psychiatry: Antidepressants - bupropion  Failed - 05/26/2024  3:22 PM      Failed - Cr in normal range and within 360 days    Creatinine, Ser  Date Value Ref Range Status  03/21/2023 1.15 (H) 0.57 - 1.00 mg/dL Final         Failed - AST in normal range and within 360 days    AST  Date Value Ref Range Status  03/21/2023 18 0 - 40 IU/L Final         Failed - ALT in normal range and within 360 days    ALT  Date Value Ref Range Status  03/21/2023 18 0 - 32 IU/L Final         Failed - Completed PHQ-2 or PHQ-9 in the last 360 days      Passed - Last BP in normal range    BP Readings from Last 1 Encounters:  02/13/24 116/78         Passed - Valid encounter within last 6 months    Recent Outpatient Visits           3 months ago Anxiety state   [facility], MD   6 months ago Essential hypertension   [facility], MD   1 year ago Annual physical exam   [facility], MD   1 year ago Uncontrolled hypertension   [facility], MD   1 year ago Essential hypertension   [facility] [place], MD               hydrOXYzine  (VISTARIL ) 25 MG capsule [Pharmacy Med Name: HYDROXYZINE  PAM 25 MG CAP] 60 capsule 0    Sig: TAKE 1 CAPSULE (25 MG  TOTAL) BY MOUTH EVERY 8 (EIGHT) HOURS AS NEEDED.     Ear, Nose, and Throat:  Antihistamines 2 Failed - 05/26/2024  3:22 PM      Failed - Cr in normal range and within 360 days    Creatinine, Ser  Date Value Ref Range Status  03/21/2023 1.15 (H) 0.57 - 1.00 mg/dL Final         Passed - Valid encounter within last 12 months    Recent Outpatient Visits           3 months ago Anxiety state   [facility], MD   6 months ago Essential hypertension   [facility], MD   1 year ago Annual physical exam   [facility], MD   1 year ago Uncontrolled hypertension   [facility] [place],  MD   1 year ago Essential hypertension   [facility], MD

## 2024-05-28 ENCOUNTER — Other Ambulatory Visit: Payer: Self-pay | Admitting: Family Medicine

## 2024-05-28 ENCOUNTER — Ambulatory Visit: Payer: Self-pay

## 2024-05-28 NOTE — Telephone Encounter (Signed)
 FYI Only or Action Required?: FYI only for provider: appointment scheduled on 05/29/24.  Patient was last seen in primary care on 02/01/2024 by Tanda Bleacher, MD.  Called Nurse Triage reporting Cough.  Symptoms began yesterday.  Interventions attempted: Nothing.  Symptoms are: unchanged.  Triage Disposition: See Physician Within 24 Hours  Patient/caregiver understands and will follow disposition?: Yes   Copied from CRM [id number]. Topic: Clinical - Red Word Triage >> May 28, 2024 11:56 AM Joesph NOVAK wrote: Red Word that prompted transfer to Nurse Triage: productive coughing/mucous Reason for Disposition  [1] Continuous (nonstop) coughing interferes with work or school AND [2] no improvement using cough treatment per Care Advice  Answer Assessment - Initial Assessment Questions Scheduled 05/29/24  Advised call back or ED/911 if symptoms occur/worsen: severe diff breathing, chest pain > 5 min, faint. Patient verbalized understanding.   1. ONSET: When did the cough begin?      sunday 2. SEVERITY: How bad is the cough today?      Cough; prod cough, unsure how to answer 3. SPUTUM: Describe the color of your sputum (e.g., none, dry cough; clear, white, yellow, green)     Off white 4. HEMOPTYSIS: Are you coughing up any blood? If Yes, ask: How much? (e.g., flecks, streaks, tablespoons, etc.)     no 5. DIFFICULTY BREATHING: Are you having difficulty breathing? If Yes, ask: How bad is it? (e.g., mild, moderate, severe)      no 6. FEVER: Do you have a fever? If Yes, ask: What is your temperature, how was it measured, and when did it start?     Denies fever chills n/v 7. CARDIAC HISTORY: Do you have any history of heart disease? (e.g., heart attack, congestive heart failure)      no 8. LUNG HISTORY: Do you have any history of lung disease?  (e.g., pulmonary embolus, asthma, emphysema)     no 9. PE RISK FACTORS: Do you have a history of blood clots? (or: recent major  surgery, recent prolonged travel, bedridden)     no 10. OTHER SYMPTOMS: Do you have any other symptoms? (e.g., runny nose, wheezing, chest pain) Denies diff breathing, chest pain, wheezing,  12. TRAVEL: Have you traveled out of the country in the last month? (e.g., travel history, exposures)       Wokr sick ppl  Protocols used: Cough - Acute Productive-A-AH

## 2024-05-28 NOTE — Telephone Encounter (Signed)
 Noted thanks

## 2024-05-29 ENCOUNTER — Encounter: Payer: Self-pay | Admitting: Nurse Practitioner

## 2024-05-29 ENCOUNTER — Ambulatory Visit (INDEPENDENT_AMBULATORY_CARE_PROVIDER_SITE_OTHER): Admitting: Nurse Practitioner

## 2024-05-29 VITALS — BP 138/83 | HR 77 | Temp 97.6°F | Wt 153.2 lb

## 2024-05-29 DIAGNOSIS — R059 Cough, unspecified: Secondary | ICD-10-CM

## 2024-05-29 DIAGNOSIS — J069 Acute upper respiratory infection, unspecified: Secondary | ICD-10-CM

## 2024-05-29 LAB — POC COVID19 BINAXNOW: SARS Coronavirus 2 Ag: NEGATIVE

## 2024-05-29 LAB — POCT INFLUENZA A/B: Influenza A, POC: NEGATIVE

## 2024-05-29 MED ORDER — BENZONATATE 200 MG PO CAPS: 200.0000 mg | ORAL_CAPSULE | Freq: Two times a day (BID) | ORAL | 0 refills | Status: AC | PRN

## 2024-05-29 MED ORDER — AZITHROMYCIN 250 MG PO TABS: ORAL_TABLET | ORAL | 0 refills | Status: AC

## 2024-05-29 NOTE — Progress Notes (Signed)
 "  Subjective   Patient ID: Taylor Booth, female    DOB: [birth year], 49 y.o.   MRN: [id number]  Chief Complaint  Patient presents with   Cough    X 3 days. Runny nose. Begin with dry sinus.     Referring provider: Tanda Bleacher, MD  Taylor Booth is a 49 y.o. female with Past Medical History: No date: Allergy 2020: Anemia     Comment:  iron deficiency, eval Dr. Arley Hof  No date: Anxiety 2020: Chronic kidney disease     Comment:  nephrotic syndrome, hypoalbuminemia .  Dr. Bernardino Gasman,              [facility] 10/2017: Depression 10/2017: Hyperlipidemia 10/2017: Hypertension No date: Thyroid  nodule     Comment:  2020, recommended biopsy, but declined at the time No date: Wears glasses   HPI  Cough at night  No fever  No ear pain or sire throat     Allergies[1]  Immunization History  Administered Date(s) Administered   Influenza, Seasonal, Injecte, Preservative Fre 02/22/2023   PPD Test 09/19/2021    Tobacco History: Tobacco Use History[2] Counseling given: Not Answered Tobacco comments: quit 48 days ago   Outpatient Encounter Medications as of 05/29/2024  Medication Sig   amLODipine  (NORVASC ) 10 MG tablet Take 1 tablet (10 mg total) by mouth daily.   azithromycin  (ZITHROMAX ) 250 MG tablet Take 2 tablets on day 1, then 1 tablet daily on days 2 through 5   benzonatate  (TESSALON ) 200 MG capsule Take 1 capsule (200 mg total) by mouth 2 (two) times daily as needed for cough.   buPROPion  (WELLBUTRIN  XL) 150 MG 24 hr tablet Take 3 tablets (450 mg total) by mouth daily.   buPROPion  (WELLBUTRIN  XL) 300 MG 24 hr tablet TAKE 1 TABLET BY MOUTH EVERY DAY   cholecalciferol (VITAMIN D3) 25 MCG (1000 UNIT) tablet Take 1 tablet (1,000 Units total) by mouth daily.   hydrOXYzine  (VISTARIL ) 25 MG capsule TAKE 1 CAPSULE (25 MG TOTAL) BY MOUTH EVERY 8 (EIGHT) HOURS AS NEEDED.   losartan -hydrochlorothiazide  (HYZAAR) 100-25 MG tablet Take 1 tablet by mouth daily.    Multiple Vitamins-Minerals (MULTIVITAMIN WITH MINERALS) tablet Take 1 tablet by mouth daily.   BLACK COHOSH PO Take by mouth. (Patient not taking: Reported on 05/29/2024)   brexpiprazole  (REXULTI ) 1 MG TABS tablet Take 1 tablet (1 mg total) by mouth daily. (Patient not taking: Reported on 05/29/2024)   naphazoline-pheniramine (NAPHCON-A) 0.025-0.3 % ophthalmic solution Place 1 drop into the right eye every 4 (four) hours as needed for eye irritation. (Patient not taking: Reported on 05/29/2024)   UNKNOWN TO PATIENT Rx eye oint. (Previous Rx). (Patient not taking: Reported on 05/29/2024)   No facility-administered encounter medications on file as of 05/29/2024.    Review of Systems  Review of Systems  Constitutional: Negative.   HENT:  Positive for congestion, postnasal drip, rhinorrhea, sinus pressure and sinus pain.   Respiratory:  Positive for cough.   Cardiovascular: Negative.   Gastrointestinal: Negative.   Allergic/Immunologic: Negative.   Neurological: Negative.   Psychiatric/Behavioral: Negative.       Objective:   BP 138/83   Pulse 77   Temp 97.6 F (36.4 C) (Temporal)   Wt 153 lb 3.2 oz (69.5 kg)   SpO2 100%   BMI 27.14 kg/m   Wt Readings from Last 5 Encounters:  05/29/24 153 lb 3.2 oz (69.5 kg)  02/13/24 151 lb (68.5 kg)  02/01/24 150 lb 3.2 oz (  68.1 kg)  11/01/23 153 lb 6.4 oz (69.6 kg)  03/21/23 169 lb (76.7 kg)     Physical Exam Vitals and nursing note reviewed.  Constitutional:      General: She is not in acute distress.    Appearance: She is well-developed.  HENT:     Nose: Congestion present.  Cardiovascular:     Rate and Rhythm: Normal rate and regular rhythm.  Pulmonary:     Effort: Pulmonary effort is normal.     Breath sounds: Normal breath sounds.  Neurological:     Mental Status: She is alert and oriented to person, place, and time.       Assessment & Plan:   Cough, unspecified type -     POCT Influenza A/B -     POC COVID-19  BinaxNow -     Benzonatate ; Take 1 capsule (200 mg total) by mouth 2 (two) times daily as needed for cough.  Dispense: 20 capsule; Refill: 0  Upper respiratory tract infection, unspecified type -     Benzonatate ; Take 1 capsule (200 mg total) by mouth 2 (two) times daily as needed for cough.  Dispense: 20 capsule; Refill: 0 -     Azithromycin ; Take 2 tablets on day 1, then 1 tablet daily on days 2 through 5  Dispense: 6 tablet; Refill: 0     Return if symptoms worsen or fail to improve.   Bascom GORMAN Borer, NP 05/29/2024     [1]  Allergies Allergen Reactions   Penicillins Nausea And Vomiting    Did it involve swelling of the face/tongue/throat, SOB, or low BP? No Did it involve sudden or severe rash/hives, skin peeling, or any reaction on the inside of your mouth or nose? No Did you need to seek medical attention at a hospital or doctor's office? No When did it last happen?      more than 10 years  If all above answers are NO, may proceed with cephalosporin use.   [2]  Social History Tobacco Use  Smoking Status Former   Current packs/day: 0.00   Average packs/day: 0.5 packs/day for 11.0 years (5.5 ttl pk-yrs)   Types: Cigarettes   Start date: 07/03/2006   Quit date: 07/03/2017   Years since quitting: 6.9  Smokeless Tobacco Never  Tobacco Comments   quit 48 days ago   "
